# Patient Record
Sex: Male | Born: 1965 | Race: Black or African American | Hispanic: No | Marital: Married | Smoking: Light tobacco smoker
Health system: Southern US, Community
[De-identification: ages and names within clinical notes are randomized; demographics above are authoritative.]

## PROBLEM LIST (undated history)

## (undated) ENCOUNTER — Emergency Department (HOSPITAL_COMMUNITY): Admission: EM | Payer: BC Managed Care – PPO | Source: Home / Self Care

## (undated) DIAGNOSIS — Z789 Other specified health status: Secondary | ICD-10-CM

## (undated) DIAGNOSIS — F1910 Other psychoactive substance abuse, uncomplicated: Secondary | ICD-10-CM

## (undated) DIAGNOSIS — Z7289 Other problems related to lifestyle: Secondary | ICD-10-CM

## (undated) DIAGNOSIS — F1090 Alcohol use, unspecified, uncomplicated: Secondary | ICD-10-CM

## (undated) DIAGNOSIS — E785 Hyperlipidemia, unspecified: Secondary | ICD-10-CM

## (undated) DIAGNOSIS — Z91018 Allergy to other foods: Secondary | ICD-10-CM

## (undated) DIAGNOSIS — T782XXA Anaphylactic shock, unspecified, initial encounter: Secondary | ICD-10-CM

## (undated) HISTORY — DX: Hyperlipidemia, unspecified: E78.5

## (undated) HISTORY — PX: COLONOSCOPY: SHX174

## (undated) HISTORY — DX: Alcohol use, unspecified, uncomplicated: F10.90

## (undated) HISTORY — DX: Anaphylactic shock, unspecified, initial encounter: T78.2XXA

## (undated) HISTORY — PX: UPPER GASTROINTESTINAL ENDOSCOPY: SHX188

## (undated) HISTORY — DX: Other problems related to lifestyle: Z72.89

## (undated) HISTORY — PX: WISDOM TOOTH EXTRACTION: SHX21

## (undated) HISTORY — DX: Allergy to other foods: Z91.018

## (undated) HISTORY — DX: Other specified health status: Z78.9

## (undated) HISTORY — PX: SIGMOIDOSCOPY: SUR1295

## (undated) HISTORY — DX: Other psychoactive substance abuse, uncomplicated: F19.10

## (undated) SURGERY — ESOPHAGOGASTRODUODENOSCOPY (EGD) WITH PROPOFOL
Anesthesia: Monitor Anesthesia Care

---

## 1987-04-24 HISTORY — PX: OTHER SURGICAL HISTORY: SHX169

## 1998-01-12 ENCOUNTER — Emergency Department (HOSPITAL_COMMUNITY): Admission: EM | Admit: 1998-01-12 | Discharge: 1998-01-12 | Payer: Self-pay | Admitting: Emergency Medicine

## 1998-12-24 ENCOUNTER — Emergency Department (HOSPITAL_COMMUNITY): Admission: EM | Admit: 1998-12-24 | Discharge: 1998-12-24 | Payer: Self-pay | Admitting: Emergency Medicine

## 1998-12-31 ENCOUNTER — Emergency Department (HOSPITAL_COMMUNITY): Admission: EM | Admit: 1998-12-31 | Discharge: 1998-12-31 | Payer: Self-pay | Admitting: Emergency Medicine

## 1999-02-15 ENCOUNTER — Emergency Department (HOSPITAL_COMMUNITY): Admission: EM | Admit: 1999-02-15 | Discharge: 1999-02-15 | Payer: Self-pay | Admitting: Emergency Medicine

## 2000-04-01 ENCOUNTER — Emergency Department (HOSPITAL_COMMUNITY): Admission: EM | Admit: 2000-04-01 | Discharge: 2000-04-01 | Payer: Self-pay | Admitting: Emergency Medicine

## 2001-05-24 ENCOUNTER — Emergency Department (HOSPITAL_COMMUNITY): Admission: EM | Admit: 2001-05-24 | Discharge: 2001-05-24 | Payer: Self-pay | Admitting: Emergency Medicine

## 2003-01-02 ENCOUNTER — Emergency Department (HOSPITAL_COMMUNITY): Admission: EM | Admit: 2003-01-02 | Discharge: 2003-01-02 | Payer: Self-pay | Admitting: Emergency Medicine

## 2003-01-02 ENCOUNTER — Encounter: Payer: Self-pay | Admitting: Emergency Medicine

## 2003-02-25 ENCOUNTER — Emergency Department (HOSPITAL_COMMUNITY): Admission: EM | Admit: 2003-02-25 | Discharge: 2003-02-25 | Payer: Self-pay | Admitting: Emergency Medicine

## 2003-02-26 ENCOUNTER — Emergency Department (HOSPITAL_COMMUNITY): Admission: EM | Admit: 2003-02-26 | Discharge: 2003-02-26 | Payer: Self-pay | Admitting: Emergency Medicine

## 2005-11-30 ENCOUNTER — Ambulatory Visit (HOSPITAL_BASED_OUTPATIENT_CLINIC_OR_DEPARTMENT_OTHER): Admission: RE | Admit: 2005-11-30 | Discharge: 2005-11-30 | Payer: Self-pay | Admitting: Urology

## 2005-11-30 ENCOUNTER — Encounter (INDEPENDENT_AMBULATORY_CARE_PROVIDER_SITE_OTHER): Payer: Self-pay | Admitting: Specialist

## 2008-09-17 ENCOUNTER — Emergency Department (HOSPITAL_COMMUNITY): Admission: EM | Admit: 2008-09-17 | Discharge: 2008-09-17 | Payer: Self-pay | Admitting: Emergency Medicine

## 2010-08-01 LAB — POCT I-STAT, CHEM 8
BUN: 7 mg/dL (ref 6–23)
Chloride: 102 mEq/L (ref 96–112)
Hemoglobin: 16 g/dL (ref 13.0–17.0)

## 2010-08-01 LAB — POCT CARDIAC MARKERS
Myoglobin, poc: 121 ng/mL (ref 12–200)
Troponin i, poc: <0.05 ng/mL (ref 0.00–0.09)

## 2010-09-08 NOTE — Op Note (Signed)
NAME:  Parker Nguyen, Parker Nguyen            ACCOUNT NO.:  [id number]   MEDICAL RECORD NO.:  [id number]          PATIENT TYPE:  AMB   LOCATION:  [facility]                         FACILITY:  [facility]   PHYSICIAN:  Boston Service, M.D.DATE OF BIRTH:  01-13-1966   DATE OF PROCEDURE:  11/30/2005  DATE OF DISCHARGE:                                 OPERATIVE REPORT   PREOPERATIVE DIAGNOSIS:  Undesired fertility.  The patient has had a last  minute no show on outpatient office vasectomy twice, was offered vasectomy  under anesthesia which he strongly preferred.   PROCEDURE:  Vasectomy under anesthesia.   SURGEON:  Dr. Wanda Plump.   ASSISTANT:  None.   ANESTHESIA:  General.   SPECIMENS:  Right and left vas.   ESTIMATED BLOOD LOSS:  Minimal.   COMPLICATIONS:  None obvious.   DESCRIPTION:  The patient was prepped and draped, supine position.  After  institution of an adequate level of general anesthesia, small area of skin  and submucosa anesthetized with quarter percent lidocaine, anterior aspect  of the right and left hemi scrotum, vas brought to the skin.  Small  transverse incision made with the cautery.  Needle tip forceps dissected the  vas free from its surrounding subcutaneous attachments.  Ring forceps  brought the vas through the small incision.  It was clean distally and  proximally, divided.  The needle tip cautery was then inserted down the  lumen of the distal end of the vas which was then cauterized.  A small  segment of the vas was removed and sent to pathology.  Proximal end of the  vas was then oversewn with 4-0 chromic sewn back on itself in order to coapt  the vas lumen.  Dartos was closed with a figure-of-eight suture of 4-0  chromic.  Skin was closed with figure-of-eight suture of 4-0 chromic.  Identical technique was used on both the right and left sides.  The patient  tolerated the procedure well and was returned to recovery in satisfactory  condition.     ______________________________  Boston Service, M.D.     RH/MEDQ  D:  11/30/2005  T:  11/30/2005  Job:  [id number]   cc:   Oley Balm. Georgina Pillion, M.D.  Fax: [phone]

## 2010-09-08 NOTE — Consult Note (Signed)
NAME:  Parker Nguyen, Parker Nguyen                      ACCOUNT NO.:  [id number]   MEDICAL RECORD NO.:  [id number]                   PATIENT TYPE:  EMS   LOCATION:  ED                                   FACILITY:  [facility]   PHYSICIAN:  Artist Pais. Mina Marble, M.D.           DATE OF BIRTH:  1965/06/11   DATE OF CONSULTATION:  01/02/2003  DATE OF DISCHARGE:                                   CONSULTATION   REFERRED BY:  Bethann Berkshire, M.D.   REASON FOR CONSULTATION:  [name] is a 45 year old, right hand  dominant male who was working on his own car in his garage and had a large  object fall on his dominant right thumb and presents today with an obvious  open fracture to the distal phalanx, nail bed laceration, nail plate  fracture, etc.  He is an otherwise healthy 45 year old male with no known  drug allergies. No current medications, no recent hospitalizations or  surgeries.   FAMILY HISTORY:  Noncontributory.   SOCIAL HISTORY:  Noncontributory.   PHYSICAL EXAMINATION:  GENERAL:  Reveals a well-developed, well-nourished  male, pleasant.  EXTREMITIES:  Right thumb, he has an obvious open injury to the distal  phalanx with exposed distal phalangeal bone, fractured nail plate and nail  bed laceration. He is neurovascularly intact grossly to sharp dull. X-rays  showed a comminuted distal phalangeal fracture, the rest of his upper  extremity exam is normal.   DESCRIPTION OF PROCEDURE:  He was given 2% plain lidocaine digital sheath  block. After adequate anesthesia was obtained, he was prepped and draped in  the usual sterile fashion. A freer elevator was used to carefully elevate  the remaining nail plate from the underlying nail bed. The open fracture was  debrided of clot and grease, irrigated with a liter of normal of saline  using a syringe and an 18 gauge Angiocath. This was followed by reduction of  the fracture and repair of the nail bed using 6-0 undyed Vicryl and the skin  using 5-0 nylon. The remnants of the nail plate were then placed on the  eponychial fold to help keep the normal contour. It was then dressed on  Xeroform, 4 x 4, a Coban wrap and a volar split. The patient tolerated the  procedure well and was discharged from the emergency department with Keflex  500 mg n.p.o. q.i.d. for a week for antibiotic prophylaxis, #28, no refills  and also Vicodin 20 for pain, no refills, 1-2 every 3-4 hours as needed for  pain. Followup in my office in five days.                                               Artist Pais Mina Marble, M.D.    MAW/MEDQ  D:  01/02/2003  T:  01/02/2003  Job:  [id number]

## 2010-11-01 ENCOUNTER — Emergency Department (HOSPITAL_COMMUNITY)
Admission: EM | Admit: 2010-11-01 | Discharge: 2010-11-01 | Disposition: A | Discharge status: Home / Self Care | Payer: BC Managed Care – PPO | Attending: Emergency Medicine | Admitting: Emergency Medicine

## 2010-11-01 DIAGNOSIS — R112 Nausea with vomiting, unspecified: Secondary | ICD-10-CM | POA: Insufficient documentation

## 2010-11-01 DIAGNOSIS — R000 Tachycardia, unspecified: Secondary | ICD-10-CM | POA: Insufficient documentation

## 2010-11-01 DIAGNOSIS — I428 Other cardiomyopathies: Secondary | ICD-10-CM | POA: Insufficient documentation

## 2010-11-01 DIAGNOSIS — I10 Essential (primary) hypertension: Secondary | ICD-10-CM | POA: Insufficient documentation

## 2010-11-01 DIAGNOSIS — L2989 Other pruritus: Secondary | ICD-10-CM | POA: Insufficient documentation

## 2010-11-01 DIAGNOSIS — T7840XA Allergy, unspecified, initial encounter: Secondary | ICD-10-CM | POA: Insufficient documentation

## 2010-11-01 DIAGNOSIS — L298 Other pruritus: Secondary | ICD-10-CM | POA: Insufficient documentation

## 2010-11-01 DIAGNOSIS — R51 Headache: Secondary | ICD-10-CM | POA: Insufficient documentation

## 2010-11-01 DIAGNOSIS — R21 Rash and other nonspecific skin eruption: Secondary | ICD-10-CM | POA: Insufficient documentation

## 2015-11-08 DIAGNOSIS — M25541 Pain in joints of right hand: Secondary | ICD-10-CM | POA: Diagnosis not present

## 2015-12-13 DIAGNOSIS — T781XXA Other adverse food reactions, not elsewhere classified, initial encounter: Secondary | ICD-10-CM | POA: Diagnosis not present

## 2015-12-13 DIAGNOSIS — H1045 Other chronic allergic conjunctivitis: Secondary | ICD-10-CM | POA: Diagnosis not present

## 2015-12-13 DIAGNOSIS — R21 Rash and other nonspecific skin eruption: Secondary | ICD-10-CM | POA: Diagnosis not present

## 2015-12-28 ENCOUNTER — Encounter: Payer: Self-pay | Admitting: Adult Health

## 2015-12-28 ENCOUNTER — Ambulatory Visit (INDEPENDENT_AMBULATORY_CARE_PROVIDER_SITE_OTHER): Payer: BLUE CROSS/BLUE SHIELD | Admitting: Adult Health

## 2015-12-28 VITALS — BP 136/78 | Temp 98.6°F | Ht 69.0 in | Wt 168.8 lb

## 2015-12-28 DIAGNOSIS — F17200 Nicotine dependence, unspecified, uncomplicated: Secondary | ICD-10-CM | POA: Diagnosis not present

## 2015-12-28 DIAGNOSIS — Z7689 Persons encountering health services in other specified circumstances: Secondary | ICD-10-CM

## 2015-12-28 DIAGNOSIS — Z23 Encounter for immunization: Secondary | ICD-10-CM | POA: Diagnosis not present

## 2015-12-28 DIAGNOSIS — Z0189 Encounter for other specified special examinations: Secondary | ICD-10-CM

## 2015-12-28 DIAGNOSIS — Z7189 Other specified counseling: Secondary | ICD-10-CM | POA: Diagnosis not present

## 2015-12-28 MED ORDER — VARENICLINE TARTRATE 0.5 MG X 11 & 1 MG X 42 PO MISC: ORAL | 0 refills | Status: DC

## 2015-12-28 MED ORDER — VARENICLINE TARTRATE 1 MG PO TABS: 1.0000 mg | ORAL_TABLET | Freq: Two times a day (BID) | ORAL | 0 refills | Status: DC

## 2015-12-28 NOTE — Patient Instructions (Addendum)
It was great meeting you today!  Please follow up yearly for your physical exam.   You are due for your colonoscopy when you turn 50   Please let me know if you need anything  Health Maintenance, Male A healthy lifestyle and preventative care can promote health and wellness.  Maintain regular health, dental, and eye exams.  Eat a healthy diet. Foods like vegetables, fruits, whole grains, low-fat dairy products, and lean protein foods contain the nutrients you need and are low in calories. Decrease your intake of foods high in solid fats, added sugars, and salt. Get information about a proper diet from your health care provider, if necessary.  Regular physical exercise is one of the most important things you can do for your health. Most adults should get at least 150 minutes of moderate-intensity exercise (any activity that increases your heart rate and causes you to sweat) each week. In addition, most adults need muscle-strengthening exercises on 2 or more days a week.   Maintain a healthy weight. The body mass index (BMI) is a screening tool to identify possible weight problems. It provides an estimate of body fat based on height and weight. Your health care provider can find your BMI and can help you achieve or maintain a healthy weight. For males 20 years and older:  A BMI below 18.5 is considered underweight.  A BMI of 18.5 to 24.9 is normal.  A BMI of 25 to 29.9 is considered overweight.  A BMI of 30 and above is considered obese.  Maintain normal blood lipids and cholesterol by exercising and minimizing your intake of saturated fat. Eat a balanced diet with plenty of fruits and vegetables. Blood tests for lipids and cholesterol should begin at age 74 and be repeated every 5 years. If your lipid or cholesterol levels are high, you are over age 37, or you are at high risk for heart disease, you may need your cholesterol levels checked more frequently.Ongoing high lipid and  cholesterol levels should be treated with medicines if diet and exercise are not working.  If you smoke, find out from your health care provider how to quit. If you do not use tobacco, do not start.  Lung cancer screening is recommended for adults aged 53-80 years who are at high risk for developing lung cancer because of a history of smoking. A yearly low-dose CT scan of the lungs is recommended for people who have at least a 30-pack-year history of smoking and are current smokers or have quit within the past 15 years. A pack year of smoking is smoking an average of 1 pack of cigarettes a day for 1 year (for example, a 30-pack-year history of smoking could mean smoking 1 pack a day for 30 years or 2 packs a day for 15 years). Yearly screening should continue until the smoker has stopped smoking for at least 15 years. Yearly screening should be stopped for people who develop a health problem that would prevent them from having lung cancer treatment.  If you choose to drink alcohol, do not have more than 2 drinks per day. One drink is considered to be 12 oz (360 mL) of beer, 5 oz (150 mL) of wine, or 1.5 oz (45 mL) of liquor.  Avoid the use of street drugs. Do not share needles with anyone. Ask for help if you need support or instructions about stopping the use of drugs.  High blood pressure causes heart disease and increases the risk of stroke. High  blood pressure is more likely to develop in:  People who have blood pressure in the end of the normal range (100-139/85-89 mm Hg).  People who are overweight or obese.  People who are African American.  If you are 12-39 years of age, have your blood pressure checked every 3-5 years. If you are 55 years of age or older, have your blood pressure checked every year. You should have your blood pressure measured twice--once when you are at a hospital or clinic, and once when you are not at a hospital or clinic. Record the average of the two measurements. To  check your blood pressure when you are not at a hospital or clinic, you can use:  An automated blood pressure machine at a pharmacy.  A home blood pressure monitor.  If you are 69-78 years old, ask your health care provider if you should take aspirin to prevent heart disease.  Diabetes screening involves taking a blood sample to check your fasting blood sugar level. This should be done once every 3 years after age 11 if you are at a normal weight and without risk factors for diabetes. Testing should be considered at a younger age or be carried out more frequently if you are overweight and have at least 1 risk factor for diabetes.  Colorectal cancer can be detected and often prevented. Most routine colorectal cancer screening begins at the age of 48 and continues through age 62. However, your health care provider may recommend screening at an earlier age if you have risk factors for colon cancer. On a yearly basis, your health care provider may provide home test kits to check for hidden blood in the stool. A small camera at the end of a tube may be used to directly examine the colon (sigmoidoscopy or colonoscopy) to detect the earliest forms of colorectal cancer. Talk to your health care provider about this at age 68 when routine screening begins. A direct exam of the colon should be repeated every 5-10 years through age 7, unless early forms of precancerous polyps or small growths are found.  People who are at an increased risk for hepatitis B should be screened for this virus. You are considered at high risk for hepatitis B if:  You were born in a country where hepatitis B occurs often. Talk with your health care provider about which countries are considered high risk.  Your parents were born in a high-risk country and you have not received a shot to protect against hepatitis B (hepatitis B vaccine).  You have HIV or AIDS.  You use needles to inject street drugs.  You live with, or have sex  with, someone who has hepatitis B.  You are a man who has sex with other men (MSM).  You get hemodialysis treatment.  You take certain medicines for conditions like cancer, organ transplantation, and autoimmune conditions.  Hepatitis C blood testing is recommended for all people born from [birth year] through 1964 and any individual with known risk factors for hepatitis C.  Healthy men should no longer receive prostate-specific antigen (PSA) blood tests as part of routine cancer screening. Talk to your health care provider about prostate cancer screening.  Testicular cancer screening is not recommended for adolescents or adult males who have no symptoms. Screening includes self-exam, a health care provider exam, and other screening tests. Consult with your health care provider about any symptoms you have or any concerns you have about testicular cancer.  Practice safe sex. Use condoms and  avoid high-risk sexual practices to reduce the spread of sexually transmitted infections (STIs).  You should be screened for STIs, including gonorrhea and chlamydia if:  You are sexually active and are younger than 24 years.  You are older than 24 years, and your health care provider tells you that you are at risk for this type of infection.  Your sexual activity has changed since you were last screened, and you are at an increased risk for chlamydia or gonorrhea. Ask your health care provider if you are at risk.  If you are at risk of being infected with HIV, it is recommended that you take a prescription medicine daily to prevent HIV infection. This is called pre-exposure prophylaxis (PrEP). You are considered at risk if:  You are a man who has sex with other men (MSM).  You are a heterosexual man who is sexually active with multiple partners.  You take drugs by injection.  You are sexually active with a partner who has HIV.  Talk with your health care provider about whether you are at high risk of being  infected with HIV. If you choose to begin PrEP, you should first be tested for HIV. You should then be tested every 3 months for as long as you are taking PrEP.  Use sunscreen. Apply sunscreen liberally and repeatedly throughout the day. You should seek shade when your shadow is shorter than you. Protect yourself by wearing long sleeves, pants, a wide-brimmed hat, and sunglasses year round whenever you are outdoors.  Tell your health care provider of new moles or changes in moles, especially if there is a change in shape or color. Also, tell your health care provider if a mole is larger than the size of a pencil eraser.  A one-time screening for abdominal aortic aneurysm (AAA) and surgical repair of large AAAs by ultrasound is recommended for men aged 96-75 years who are current or former smokers.  Stay current with your vaccines (immunizations).   This information is not intended to replace advice given to you by your health care provider. Make sure you discuss any questions you have with your health care provider.   Document Released: 10/06/2007 Document Revised: 04/30/2014 Document Reviewed: 09/04/2010 Elsevier Interactive Patient Education [employer].

## 2015-12-28 NOTE — Progress Notes (Signed)
Patient presents to clinic today to establish care. He is a pleasant 50 year old male who  has a past medical history of Alcohol use ([place]) and Hypotension.   Acute Concerns: Establish Care   Chronic Issues:  Tobacco Use - He is currently smoking 10 cigarettes per day. He has quit in the past with Chantix and would like to try this again.   Health Maintenance: Dental -- Routine Eye Care Vision -- Does not see routine  Immunizations -- UTD  Colonoscopy - Never had Diet: Eats healthy  Exercise: Teaches boxing.   Past Medical History:  Diagnosis Date  . Alcohol use ([place])   . Hypotension     Past Surgical History:  Procedure Laterality Date  . Left arm fracture  1989    No current outpatient prescriptions on file prior to visit.   No current facility-administered medications on file prior to visit.     Allergies  Allergen Reactions  . Red Dye     "N/V, chills"    Family History  Problem Relation Age of Onset  . Cancer Mother   . Hypertension Mother   . Diabetes Mother   . Heart disease Father   . Kidney disease Father   . Diabetes Father   . Colon polyps Father     Social History   Social History  . Marital status: Married    Spouse name: N/A  . Number of children: N/A  . Years of education: N/A   Occupational History  . Not on file.   Social History Main Topics  . Smoking status: Current Every Day Smoker    Packs/day: 0.25    Types: Cigarettes  . Smokeless tobacco: Never Used  . Alcohol use 1.8 - 3.0 oz/week    3 - 5 Cans of beer per week  . Drug use: No  . Sexual activity: Not on file   Other Topics Concern  . 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   Respiratory: Negative.   Cardiovascular: Negative.   Genitourinary: Negative.   Musculoskeletal: Negative.   Neurological: Negative.   All other systems reviewed and are negative.   BP 136/78    Temp 98.6 F (37 C) (Oral)   Ht 5\' 9"  (1.753 m)   Wt 168 lb 12.8 oz (76.6 kg)   BMI 24.93 kg/m   Physical Exam  Constitutional: He is oriented to person, place, and time and well-developed, well-nourished, and in no distress. No distress.  Cardiovascular: Normal rate, regular rhythm, normal heart sounds and intact distal pulses.  Exam reveals no gallop and no friction rub.   No murmur heard. Pulmonary/Chest: Effort normal and breath sounds normal. No respiratory distress. He has no wheezes. He has no rales. He exhibits no tenderness.  Musculoskeletal: Normal range of motion. He exhibits no edema, tenderness or deformity.  Neurological: He is alert and oriented to person, place, and time. Gait normal. GCS score is 15.  Skin: Skin is warm and dry. No rash noted. He is not diaphoretic. No erythema. No pallor.  Psychiatric: Mood, memory, affect and judgment normal.  Nursing note and vitals reviewed.   Assessment/Plan: 1. Encounter to establish care - Follow up for CPE - Follow up sooner if needed - Encouraged healthy eating and exercise - Blood work done at work was scanned into file    2. Tobacco use  disorder - varenicline (CHANTIX CONTINUING MONTH PAK) 1 MG tablet; Take 1 tablet (1 mg total) by mouth 2 (two) times daily.  Dispense: 60 tablet; Refill: 0 - varenicline (CHANTIX STARTING MONTH PAK) 0.5 MG X 11 & 1 MG X 42 tablet; Take one 0.5 mg tablet by mouth once daily for 3 days, then increase to one 0.5 mg tablet twice daily for 4 days, then increase to one 1 mg tablet twice daily.  Dispense: 53 tablet; Refill: 0 Trial of chantix. Common side effects including rare risk of suicide ideation was discussed with the patient today.  Patient is instructed to go directly to the ED if this occurs.  We discussed that patient can continue to smoke for 1 week after starting chantix, but then must discontinue cigarettes.  He is also instructed to contact us prior to completion of the starter month  pack for an rx for the continuation month pack.  5 minutes spent with patient today on tobacco cessation counseling.  - Follow up in one month   3. Need for prophylactic vaccination and inoculation against influenza  - Flu Vaccine QUAD 36+ mos PF IM (Fluarix & Fluzone Quad PF)  4. Need for Tdap vaccination  - Tdap vaccine greater than or equal to 7yo IM  [name], NP

## 2015-12-29 ENCOUNTER — Telehealth: Payer: Self-pay

## 2015-12-29 NOTE — Telephone Encounter (Signed)
Had to resubmit PA on new form. Key: [id number]

## 2015-12-29 NOTE — Telephone Encounter (Signed)
Received PA request for Chantix Continuing Month Pak 1 mg tabs. PA submitted & is pending. [id number]

## 2016-01-17 ENCOUNTER — Telehealth: Payer: Self-pay

## 2016-01-17 NOTE — Telephone Encounter (Signed)
Received fax that Chantix is approved. Form faxed to pharmacy.

## 2016-07-24 ENCOUNTER — Emergency Department (HOSPITAL_COMMUNITY): Payer: BLUE CROSS/BLUE SHIELD

## 2016-07-24 ENCOUNTER — Inpatient Hospital Stay (HOSPITAL_COMMUNITY)
Admission: EM | Admit: 2016-07-24 | Discharge: 2016-07-27 | DRG: 915 | Disposition: A | Discharge status: Home / Self Care | Payer: BLUE CROSS/BLUE SHIELD | Attending: Pulmonary Disease | Admitting: Pulmonary Disease

## 2016-07-24 DIAGNOSIS — E876 Hypokalemia: Secondary | ICD-10-CM | POA: Diagnosis not present

## 2016-07-24 DIAGNOSIS — K922 Gastrointestinal hemorrhage, unspecified: Secondary | ICD-10-CM

## 2016-07-24 DIAGNOSIS — K921 Melena: Secondary | ICD-10-CM | POA: Diagnosis not present

## 2016-07-24 DIAGNOSIS — K7460 Unspecified cirrhosis of liver: Secondary | ICD-10-CM

## 2016-07-24 DIAGNOSIS — K9181 Other intraoperative complications of digestive system: Secondary | ICD-10-CM | POA: Diagnosis not present

## 2016-07-24 DIAGNOSIS — T380X5A Adverse effect of glucocorticoids and synthetic analogues, initial encounter: Secondary | ICD-10-CM | POA: Diagnosis present

## 2016-07-24 DIAGNOSIS — R0600 Dyspnea, unspecified: Secondary | ICD-10-CM | POA: Diagnosis not present

## 2016-07-24 DIAGNOSIS — I248 Other forms of acute ischemic heart disease: Secondary | ICD-10-CM | POA: Diagnosis not present

## 2016-07-24 DIAGNOSIS — D696 Thrombocytopenia, unspecified: Secondary | ICD-10-CM | POA: Diagnosis present

## 2016-07-24 DIAGNOSIS — S81801A Unspecified open wound, right lower leg, initial encounter: Secondary | ICD-10-CM | POA: Diagnosis not present

## 2016-07-24 DIAGNOSIS — I129 Hypertensive chronic kidney disease with stage 1 through stage 4 chronic kidney disease, or unspecified chronic kidney disease: Secondary | ICD-10-CM | POA: Diagnosis present

## 2016-07-24 DIAGNOSIS — T782XXS Anaphylactic shock, unspecified, sequela: Secondary | ICD-10-CM | POA: Diagnosis not present

## 2016-07-24 DIAGNOSIS — R740 Nonspecific elevation of levels of transaminase and lactic acid dehydrogenase [LDH]: Secondary | ICD-10-CM | POA: Diagnosis present

## 2016-07-24 DIAGNOSIS — D376 Neoplasm of uncertain behavior of liver, gallbladder and bile ducts: Secondary | ICD-10-CM | POA: Diagnosis not present

## 2016-07-24 DIAGNOSIS — R58 Hemorrhage, not elsewhere classified: Secondary | ICD-10-CM | POA: Diagnosis not present

## 2016-07-24 DIAGNOSIS — K7200 Acute and subacute hepatic failure without coma: Secondary | ICD-10-CM | POA: Diagnosis not present

## 2016-07-24 DIAGNOSIS — K7011 Alcoholic hepatitis with ascites: Secondary | ICD-10-CM | POA: Diagnosis not present

## 2016-07-24 DIAGNOSIS — T782XXA Anaphylactic shock, unspecified, initial encounter: Principal | ICD-10-CM | POA: Diagnosis present

## 2016-07-24 DIAGNOSIS — R188 Other ascites: Secondary | ICD-10-CM | POA: Diagnosis not present

## 2016-07-24 DIAGNOSIS — R404 Transient alteration of awareness: Secondary | ICD-10-CM | POA: Diagnosis not present

## 2016-07-24 DIAGNOSIS — K254 Chronic or unspecified gastric ulcer with hemorrhage: Secondary | ICD-10-CM | POA: Diagnosis not present

## 2016-07-24 DIAGNOSIS — D689 Coagulation defect, unspecified: Secondary | ICD-10-CM

## 2016-07-24 DIAGNOSIS — R0602 Shortness of breath: Secondary | ICD-10-CM | POA: Diagnosis not present

## 2016-07-24 DIAGNOSIS — N183 Chronic kidney disease, stage 3 (moderate): Secondary | ICD-10-CM | POA: Diagnosis present

## 2016-07-24 DIAGNOSIS — N179 Acute kidney failure, unspecified: Secondary | ICD-10-CM | POA: Diagnosis not present

## 2016-07-24 DIAGNOSIS — D1809 Hemangioma of other sites: Secondary | ICD-10-CM | POA: Diagnosis present

## 2016-07-24 DIAGNOSIS — K559 Vascular disorder of intestine, unspecified: Secondary | ICD-10-CM | POA: Diagnosis not present

## 2016-07-24 DIAGNOSIS — N200 Calculus of kidney: Secondary | ICD-10-CM | POA: Diagnosis not present

## 2016-07-24 DIAGNOSIS — K5791 Diverticulosis of intestine, part unspecified, without perforation or abscess with bleeding: Secondary | ICD-10-CM | POA: Diagnosis present

## 2016-07-24 DIAGNOSIS — R680 Hypothermia, not associated with low environmental temperature: Secondary | ICD-10-CM | POA: Diagnosis present

## 2016-07-24 DIAGNOSIS — R739 Hyperglycemia, unspecified: Secondary | ICD-10-CM | POA: Diagnosis present

## 2016-07-24 DIAGNOSIS — K2901 Acute gastritis with bleeding: Secondary | ICD-10-CM | POA: Diagnosis not present

## 2016-07-24 DIAGNOSIS — F17210 Nicotine dependence, cigarettes, uncomplicated: Secondary | ICD-10-CM | POA: Diagnosis present

## 2016-07-24 DIAGNOSIS — I517 Cardiomegaly: Secondary | ICD-10-CM | POA: Diagnosis not present

## 2016-07-24 DIAGNOSIS — I959 Hypotension, unspecified: Secondary | ICD-10-CM | POA: Diagnosis not present

## 2016-07-24 DIAGNOSIS — R160 Hepatomegaly, not elsewhere classified: Secondary | ICD-10-CM | POA: Diagnosis not present

## 2016-07-24 DIAGNOSIS — K7030 Alcoholic cirrhosis of liver without ascites: Secondary | ICD-10-CM | POA: Diagnosis not present

## 2016-07-24 DIAGNOSIS — F1010 Alcohol abuse, uncomplicated: Secondary | ICD-10-CM

## 2016-07-24 DIAGNOSIS — E872 Acidosis: Secondary | ICD-10-CM | POA: Diagnosis not present

## 2016-07-24 DIAGNOSIS — R4182 Altered mental status, unspecified: Secondary | ICD-10-CM | POA: Diagnosis not present

## 2016-07-24 DIAGNOSIS — K5190 Ulcerative colitis, unspecified, without complications: Secondary | ICD-10-CM | POA: Diagnosis not present

## 2016-07-24 DIAGNOSIS — K633 Ulcer of intestine: Secondary | ICD-10-CM | POA: Diagnosis not present

## 2016-07-24 DIAGNOSIS — K3189 Other diseases of stomach and duodenum: Secondary | ICD-10-CM | POA: Diagnosis not present

## 2016-07-24 LAB — RAPID URINE DRUG SCREEN, HOSP PERFORMED
Amphetamines: NOT DETECTED
Barbiturates: NOT DETECTED
Benzodiazepines: NOT DETECTED
COCAINE: NOT DETECTED
OPIATES: NOT DETECTED
TETRAHYDROCANNABINOL: NOT DETECTED

## 2016-07-24 LAB — I-STAT CG4 LACTIC ACID, ED: Lactic Acid, Venous: 12.18 mmol/L (ref 0.5–1.9)

## 2016-07-24 LAB — COMPREHENSIVE METABOLIC PANEL
ALK PHOS: 83 U/L (ref 38–126)
ALT: 146 U/L — AB (ref 17–63)
ANION GAP: 17 — AB (ref 5–15)
AST: 179 U/L — ABNORMAL HIGH (ref 15–41)
Albumin: 3.2 g/dL — ABNORMAL LOW (ref 3.5–5.0)
BILIRUBIN TOTAL: 1.1 mg/dL (ref 0.3–1.2)
BUN: 10 mg/dL (ref 6–20)
CALCIUM: 7.8 mg/dL — AB (ref 8.9–10.3)
CO2: 13 mmol/L — AB (ref 22–32)
CREATININE: 1.73 mg/dL — AB (ref 0.61–1.24)
Chloride: 110 mmol/L (ref 101–111)
GFR calc non Af Amer: 45 mL/min — ABNORMAL LOW (ref >60)
GFR, EST AFRICAN AMERICAN: 52 mL/min — AB (ref >60)
Glucose, Bld: 197 mg/dL — ABNORMAL HIGH (ref 65–99)
Potassium: 3.2 mmol/L — ABNORMAL LOW (ref 3.5–5.1)
SODIUM: 140 mmol/L (ref 135–145)
TOTAL PROTEIN: 5.1 g/dL — AB (ref 6.5–8.1)

## 2016-07-24 LAB — LACTIC ACID, PLASMA
LACTIC ACID, VENOUS: 5.9 mmol/L — AB (ref 0.5–1.9)
Lactic Acid, Venous: 6.8 mmol/L (ref 0.5–1.9)

## 2016-07-24 LAB — URINALYSIS, ROUTINE W REFLEX MICROSCOPIC
Bilirubin Urine: NEGATIVE
GLUCOSE, UA: NEGATIVE mg/dL
HGB URINE DIPSTICK: NEGATIVE
KETONES UR: NEGATIVE mg/dL
LEUKOCYTES UA: NEGATIVE
Nitrite: NEGATIVE
PROTEIN: NEGATIVE mg/dL
Specific Gravity, Urine: 1.004 — ABNORMAL LOW (ref 1.005–1.030)
pH: 5 (ref 5.0–8.0)

## 2016-07-24 LAB — HEMOGLOBIN AND HEMATOCRIT, BLOOD
HCT: 46.7 % (ref 39.0–52.0)
HEMATOCRIT: 46.9 % (ref 39.0–52.0)
HEMATOCRIT: 43.7 % (ref 39.0–52.0)
Hemoglobin: 16.5 g/dL (ref 13.0–17.0)
Hemoglobin: 16.7 g/dL (ref 13.0–17.0)
Hemoglobin: 15.6 g/dL (ref 13.0–17.0)

## 2016-07-24 LAB — HIV ANTIBODY (ROUTINE TESTING W REFLEX): HIV Screen 4th Generation wRfx: NONREACTIVE

## 2016-07-24 LAB — TROPONIN I
TROPONIN I: 0.13 ng/mL — AB (ref <0.03)
TROPONIN I: 1.08 ng/mL — AB (ref <0.03)
TROPONIN I: 0.96 ng/mL — AB (ref <0.03)
Troponin I: 0.24 ng/mL (ref <0.03)

## 2016-07-24 LAB — CBC WITH DIFFERENTIAL/PLATELET
Basophils Absolute: 0 10*3/uL (ref 0.0–0.1)
Basophils Relative: 0 %
EOS ABS: 0 10*3/uL (ref 0.0–0.7)
Eosinophils Relative: 0 %
HEMATOCRIT: 33.4 % — AB (ref 39.0–52.0)
HEMOGLOBIN: 12.2 g/dL — AB (ref 13.0–17.0)
LYMPHS ABS: 3 10*3/uL (ref 0.7–4.0)
LYMPHS PCT: 41 %
MCH: 32.8 pg (ref 26.0–34.0)
MCHC: 36.5 g/dL — AB (ref 30.0–36.0)
MCV: 89.8 fL (ref 78.0–100.0)
MONOS PCT: 0 %
Monocytes Absolute: 0 10*3/uL — ABNORMAL LOW (ref 0.1–1.0)
NEUTROS PCT: 59 %
Neutro Abs: 4.4 10*3/uL (ref 1.7–7.7)
Platelets: 126 10*3/uL — ABNORMAL LOW (ref 150–400)
RBC: 3.72 MIL/uL — AB (ref 4.22–5.81)
RDW: 13.3 % (ref 11.5–15.5)
WBC: 7.4 10*3/uL (ref 4.0–10.5)

## 2016-07-24 LAB — ABO/RH: ABO/RH(D): A POS

## 2016-07-24 LAB — TYPE AND SCREEN
ABO/RH(D): A POS
Antibody Screen: NEGATIVE

## 2016-07-24 LAB — GLUCOSE, CAPILLARY
GLUCOSE-CAPILLARY: 82 mg/dL (ref 65–99)
GLUCOSE-CAPILLARY: 107 mg/dL — AB (ref 65–99)

## 2016-07-24 LAB — APTT

## 2016-07-24 LAB — ETHANOL: Alcohol, Ethyl (B): <5 mg/dL (ref <5)

## 2016-07-24 LAB — PROTIME-INR
INR: 3.33
INR: 3.19
Prothrombin Time: 34.5 seconds — ABNORMAL HIGH (ref 11.4–15.2)
Prothrombin Time: 33.4 seconds — ABNORMAL HIGH (ref 11.4–15.2)

## 2016-07-24 LAB — MAGNESIUM: MAGNESIUM: 1.9 mg/dL (ref 1.7–2.4)

## 2016-07-24 LAB — OCCULT BLOOD X 1 CARD TO LAB, STOOL: FECAL OCCULT BLD: POSITIVE — AB

## 2016-07-24 LAB — PHOSPHORUS: PHOSPHORUS: 2.3 mg/dL — AB (ref 2.5–4.6)

## 2016-07-24 LAB — PROCALCITONIN: Procalcitonin: 1.91 ng/mL

## 2016-07-24 LAB — MRSA PCR SCREENING: MRSA BY PCR: NEGATIVE

## 2016-07-24 MED ORDER — HEPARIN SODIUM (PORCINE) 5000 UNIT/ML IJ SOLN: 5000.0000 [IU] | Freq: Three times a day (TID) | INTRAMUSCULAR | Status: DC

## 2016-07-24 MED ORDER — SODIUM CHLORIDE 0.9 % IV BOLUS (SEPSIS)
1000.0000 mL | Freq: Once | INTRAVENOUS | Status: AC
  Administered 2016-07-24: 1000 mL via INTRAVENOUS

## 2016-07-24 MED ORDER — EPINEPHRINE 0.3 MG/0.3ML IJ SOAJ
0.3000 mg | Freq: Once | INTRAMUSCULAR | Status: AC
  Administered 2016-07-24: 0.3 mg via INTRAMUSCULAR
  Filled 2016-07-24: qty 0.3

## 2016-07-24 MED ORDER — POTASSIUM CHLORIDE CRYS ER 20 MEQ PO TBCR: 20.0000 meq | EXTENDED_RELEASE_TABLET | Freq: Once | ORAL | Status: DC

## 2016-07-24 MED ORDER — PANTOPRAZOLE SODIUM 40 MG IV SOLR: 40.0000 mg | Freq: Two times a day (BID) | INTRAVENOUS | Status: DC

## 2016-07-24 MED ORDER — EPINEPHRINE PF 1 MG/ML IJ SOLN
0.5000 ug/min | INTRAVENOUS | Status: DC
  Filled 2016-07-24: qty 4

## 2016-07-24 MED ORDER — ONDANSETRON HCL 4 MG/2ML IJ SOLN
4.0000 mg | Freq: Four times a day (QID) | INTRAMUSCULAR | Status: DC | PRN
  Administered 2016-07-24 – 2016-07-25 (×2): 4 mg via INTRAVENOUS
  Filled 2016-07-24 (×2): qty 2

## 2016-07-24 MED ORDER — HYDRALAZINE HCL 20 MG/ML IJ SOLN
10.0000 mg | INTRAMUSCULAR | Status: DC | PRN
  Administered 2016-07-25 – 2016-07-26 (×2): 10 mg via INTRAVENOUS
  Filled 2016-07-24 (×2): qty 1

## 2016-07-24 MED ORDER — FAMOTIDINE IN NACL 20-0.9 MG/50ML-% IV SOLN: 20.0000 mg | Freq: Two times a day (BID) | INTRAVENOUS | Status: DC

## 2016-07-24 MED ORDER — DIPHENHYDRAMINE HCL 50 MG/ML IJ SOLN
25.0000 mg | Freq: Four times a day (QID) | INTRAMUSCULAR | Status: DC
  Administered 2016-07-24 – 2016-07-26 (×10): 25 mg via INTRAVENOUS
  Filled 2016-07-24 (×10): qty 1

## 2016-07-24 MED ORDER — FAMOTIDINE IN NACL 20-0.9 MG/50ML-% IV SOLN
20.0000 mg | Freq: Two times a day (BID) | INTRAVENOUS | Status: DC
  Administered 2016-07-24: 20 mg via INTRAVENOUS

## 2016-07-24 MED ORDER — SODIUM CHLORIDE 0.9 % IV SOLN
8.0000 mg/h | INTRAVENOUS | Status: DC
  Administered 2016-07-24 – 2016-07-26 (×5): 8 mg/h via INTRAVENOUS
  Filled 2016-07-24 (×9): qty 80

## 2016-07-24 MED ORDER — FOLIC ACID 5 MG/ML IJ SOLN
1.0000 mg | Freq: Every day | INTRAMUSCULAR | Status: DC
  Administered 2016-07-24 – 2016-07-26 (×3): 1 mg via INTRAVENOUS
  Filled 2016-07-24 (×4): qty 0.2

## 2016-07-24 MED ORDER — THIAMINE HCL 100 MG/ML IJ SOLN
100.0000 mg | Freq: Every day | INTRAMUSCULAR | Status: DC
  Administered 2016-07-24 – 2016-07-26 (×3): 100 mg via INTRAVENOUS
  Filled 2016-07-24 (×3): qty 2

## 2016-07-24 MED ORDER — LORATADINE 10 MG PO TABS
10.0000 mg | ORAL_TABLET | Freq: Every day | ORAL | Status: DC
  Administered 2016-07-24 – 2016-07-27 (×3): 10 mg via ORAL
  Filled 2016-07-24 (×4): qty 1

## 2016-07-24 MED ORDER — SODIUM CHLORIDE 0.9 % IV SOLN
INTRAVENOUS | Status: DC
  Administered 2016-07-24 – 2016-07-25 (×2): via INTRAVENOUS
  Administered 2016-07-25: 125 mL via INTRAVENOUS
  Administered 2016-07-26: 13:00:00 via INTRAVENOUS

## 2016-07-24 MED ORDER — INSULIN ASPART 100 UNIT/ML ~~LOC~~ SOLN
0.0000 [IU] | SUBCUTANEOUS | Status: DC
  Administered 2016-07-25 – 2016-07-27 (×11): 2 [IU] via SUBCUTANEOUS

## 2016-07-24 MED ORDER — SODIUM CHLORIDE 0.9 % IV SOLN
50.0000 ug/h | INTRAVENOUS | Status: DC
  Administered 2016-07-24 – 2016-07-25 (×3): 50 ug/h via INTRAVENOUS
  Filled 2016-07-24 (×5): qty 1

## 2016-07-24 MED ORDER — SODIUM CHLORIDE 0.9 % IV SOLN: 250.0000 mL | INTRAVENOUS | Status: DC | PRN

## 2016-07-24 MED ORDER — SODIUM CHLORIDE 0.9 % IV SOLN
80.0000 mg | Freq: Once | INTRAVENOUS | Status: AC
  Administered 2016-07-24: 11:00:00 80 mg via INTRAVENOUS
  Filled 2016-07-24: qty 80

## 2016-07-24 MED ORDER — FAMOTIDINE IN NACL 20-0.9 MG/50ML-% IV SOLN
20.0000 mg | INTRAVENOUS | Status: DC
  Filled 2016-07-24: qty 50

## 2016-07-24 MED ORDER — DIPHENHYDRAMINE HCL 50 MG/ML IJ SOLN
50.0000 mg | Freq: Four times a day (QID) | INTRAMUSCULAR | Status: DC | PRN
Start: 2016-07-24 — End: 2016-07-25

## 2016-07-24 MED ORDER — METHYLPREDNISOLONE SODIUM SUCC 125 MG IJ SOLR
60.0000 mg | Freq: Two times a day (BID) | INTRAMUSCULAR | Status: DC
  Administered 2016-07-24 – 2016-07-26 (×5): 60 mg via INTRAVENOUS
  Filled 2016-07-24 (×5): qty 2

## 2016-07-24 NOTE — Progress Notes (Signed)
Notified Dr. Ashok Cordia pt having a large amount of bloody (pink-tinged) diarrhea. Per MD will start a Protonix gtt. Will continue to monitor closely.

## 2016-07-24 NOTE — Progress Notes (Signed)
CRITICAL VALUE ALERT  Critical value received:  Lactic 5.9  Date of notification:  07/24/16  Time of notification:  7939  Critical value read back: yes  Nurse who received alert:  Lenna Sciara, RN  Verbally notified Dr. Ashok Cordia. This is expected as the value is trending down. Will continue to monitor.

## 2016-07-24 NOTE — Progress Notes (Signed)
Pt complaining of numbness in his tongue, stating it is coming from the Protonix gtt. Spoke with Pharm MD and was directed to notify the MD, d/t unlikely reaction to the Protonix. Per Dr. Ashok Cordia will continue to watch closely for now.

## 2016-07-24 NOTE — Progress Notes (Signed)
PCCM Attending Note: Notified by patient's nurse that he is having bloody bowel movements since arrival in the intensive care unit. Patient does have 2 peripheral IVs already placed. Currently hypotensive. 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  1. Checking an trending hemoglobin/hematocrit every 6 hours 2. Stat coags & stat type and screen 3. Protonix bolus and drip 4. Continue nothing by mouth status  5. Consulting GI after I assess the patient's results  Sonia Baller. Ashok Cordia, M.D. [facility] Pager:  [phone] After 3pm or if no response, call [phone] 9:52 AM 07/24/16

## 2016-07-24 NOTE — Progress Notes (Signed)
eLink Physician-Brief Progress Note Patient Name: Parker Nguyen DOB: [birth year] MRN: [id number]   Date of Service  07/24/2016  HPI/Events of Note  Hypertension - BP = 161/108. Patient is NPO.   eICU Interventions  Will order: 1. Hydralazine 10 mg IV Q 4 hours PRN SBP > 160 or DBP > 100.     Intervention Category Major Interventions: Hypertension - evaluation and management  Kayshaun Polanco [place] 07/24/2016, 4:29 PM

## 2016-07-24 NOTE — ED Provider Notes (Signed)
[facility] DEPT Provider Note   CSN: [id number] Arrival date & time: 07/24/16  3329    By signing my name below, I, Macon Large, attest that this documentation has been prepared under the direction and in the presence of Orpah Greek, MD. Electronically Signed: Macon Large, ED Scribe. 07/24/16. 3:55 AM.  History   Chief Complaint Chief Complaint  Patient presents with  . Allergic Reaction  . Altered Mental Status   The history is provided by the patient, the EMS personnel and a relative. No language interpreter was used.    LEVEL 5 CAVEAT: HPI and ROS limited due to acuity of condition.   HPI Comments: Parker Nguyen is a 51 y.o. male brought in by ambulance who presents to the Emergency Department presenting with sudden onset, allergic reaction that occurred PTA. Per EMS, they state pt was found at home sitting on a toilet in an unresponsive state. Pt had swollen eyelids and swollen cheeks. EMS notes they administered 0.3 of EPI and 50mg  of Benadryl followed by 125 Solumedrol on route to ED with minimal relief. 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 Pt is moaning and uncooperative. Per family, pt's last meal today was chicken. Pt's family denies any recent illicit drug use.   Past Medical History:  Diagnosis Date  . Alcohol use   . Hypotension     There are no active problems to display for this patient.   Past Surgical History:  Procedure Laterality Date  . Left arm fracture  1989       Home Medications    Prior to Admission medications   Not on File    Family History Family History  Problem Relation Age of Onset  . Cancer Mother   . Hypertension Mother   . Diabetes Mother   . Heart disease Father   . Kidney disease Father   . Diabetes Father   . Colon polyps Father     Social History Social History  Substance Use Topics  . Smoking  status: Current Every Day Smoker    Packs/day: 0.25    Types: Cigarettes  . Smokeless tobacco: Never Used  . Alcohol use 1.8 - 3.0 oz/week    3 - 5 Cans of beer per week     Allergies   Red dye   Review of Systems Review of Systems  Unable to perform ROS: Acuity of condition  All other systems reviewed and are negative.    Physical Exam Updated Vital Signs BP 118/64   Pulse (!) 104   Temp (!) 95.2 F (35.1 C) (Rectal)   Resp 20   SpO2 99%   Physical Exam  Constitutional: He is oriented to person, place, and time. He appears well-developed and well-nourished. No distress.  HENT:  Head: Normocephalic and atraumatic.  Right Ear: Hearing normal.  Left Ear: Hearing normal.  Nose: Nose normal.  Mouth/Throat: Oropharynx is clear and moist and mucous membranes are normal.  Eyes: Conjunctivae and EOM are normal. Pupils are equal, round, and reactive to light.  Neck: Normal range of motion. Neck supple.  Cardiovascular: Regular rhythm, S1 normal and S2 normal.  Exam reveals no gallop and no friction rub.   No murmur heard. Pulmonary/Chest: Effort normal and breath sounds normal. No respiratory distress. He exhibits no tenderness.  Abdominal: Soft. Normal appearance and bowel sounds are normal. There is no hepatosplenomegaly. There is no tenderness. There  is no rebound, no guarding, no tenderness at McBurney's point and negative Murphy's sign. No hernia.  Musculoskeletal: Normal range of motion.  Neurological: He is alert and oriented to person, place, and time. He has normal strength. No cranial nerve deficit or sensory deficit. Coordination normal. GCS eye subscore is 4. GCS verbal subscore is 5. GCS motor subscore is 6.  Not oriented.   Skin: Skin is warm, dry and intact. No rash noted. There is erythema. No cyanosis.  Diffused erythema throughout body.   Psychiatric: He has a normal mood and affect. His speech is normal and behavior is normal. Thought content normal.    Nursing note and vitals reviewed.    ED Treatments / Results   DIAGNOSTIC STUDIES: Oxygen Saturation is 96% on RA, normal by my interpretation.    COORDINATION OF CARE: 3:41 AM Discussed treatment plan with pt's family at bedside which includes labs and EKG and pt's family agreed to plan.   Labs (all labs ordered are listed, but only abnormal results are displayed) Labs Reviewed  CBC WITH DIFFERENTIAL/PLATELET - Abnormal; Notable for the following:       Result Value   RBC 3.72 (*)    Hemoglobin 12.2 (*)    HCT 33.4 (*)    MCHC 36.5 (*)    Platelets 126 (*)    Monocytes Absolute 0.0 (*)    All other components within normal limits  I-STAT CG4 LACTIC ACID, ED - Abnormal; Notable for the following:    Lactic Acid, Venous 12.18 (*)    All other components within normal limits  RAPID URINE DRUG SCREEN, HOSP PERFORMED  COMPREHENSIVE METABOLIC PANEL  ETHANOL  TROPONIN I  URINALYSIS, ROUTINE W REFLEX MICROSCOPIC    EKG  EKG Interpretation  Date/Time:  Tuesday July 24 2016 03:38:48 EDT Ventricular Rate:  130 PR Interval:    QRS Duration: 105 QT Interval:  326 QTC Calculation: 480 R Axis:   98 Text Interpretation:  Sinus tachycardia Borderline right axis deviation Repol abnrm suggests ischemia, diffuse leads, new since 2010 Confirmed by [facility]  MD, CHRISTOPHER [phone]) on 07/24/2016 3:59:24 AM       Radiology Dg Chest Port 1 View  Result Date: 07/24/2016 CLINICAL DATA:  Altered mental status.  Dyspnea this morning. EXAM: PORTABLE CHEST 1 VIEW COMPARISON:  None. FINDINGS: A single AP portable view of the chest demonstrates no focal airspace consolidation or alveolar edema. The lungs are grossly clear. There is no large effusion or pneumothorax. Cardiac and mediastinal contours appear unremarkable. IMPRESSION: No active disease. Electronically Signed   By: Andreas Newport M.D.   On: 07/24/2016 05:46    Procedures Procedures (including critical care time)  Medications  Ordered in ED Medications  EPINEPHrine (ADRENALIN) 4 mg in dextrose 5 % 250 mL (0.016 mg/mL) infusion (not administered)  sodium chloride 0.9 % bolus 1,000 mL (0 mLs Intravenous Stopped 07/24/16 0505)  EPINEPHrine (EPI-PEN) injection 0.3 mg (0.3 mg Intramuscular Given 07/24/16 0416)  sodium chloride 0.9 % bolus 1,000 mL (0 mLs Intravenous Stopped 07/24/16 0552)    Followed by  sodium chloride 0.9 % bolus 1,000 mL (1,000 mLs Intravenous New Bag/Given 07/24/16 0452)     Initial Impression / Assessment and Plan / ED Course  I have reviewed the triage vital signs and the nursing notes.  Pertinent labs & imaging results that were available during my care of the patient were reviewed by me and considered in my medical decision making (see chart for details).  Patient brought to the ER from home. Patient apparently awakened from sleep, went to the bathroom and then passed out. He had been complaining of itching and discomfort, noted to have swelling, erythema of the skin consistent with acute allergic reaction. Patient reportedly has had similar but less severe reactions to red dye in the past. He was administered epinephrine, Benadryl, Solu-Medrol by EMS. On arrival to the ER he was still hypotensive. He was given additional appendectomy and IV fluids. Patient initially seemed refractory to treatment, but after additional IV fluid resuscitation was initiated, blood pressure has improved.  Patient appears altered. He is alert and does answer questions, but there is a delay in the seems somewhat confused. He was noted to be hypothermic at arrival. There is no known signs of infection. Symptoms felt to be secondary to hypoperfusion. Head CT performed to further evaluate.  Patient's lactic acid is significantly elevated. Again not felt to be secondary to sepsis, but rather secondary to hypoperfusion.  Critical care consulted, will evaluate the patient in the ER for admission.  The patient is noted to have  a MAP's <65/ SBP's <90. With the current information available to me, I don't think the patient is in septic shock. The MAP's <65/ SBP's <90, is related to OTHER SHOCKanaphylaxis .  The patient is noted to have a lactate>4. With the current information available to me, I don't think the patient is in septic shock. The lactate>4, is related to OTHER SHOCK anaphylaxis.   CRITICAL CARE Performed by: Orpah Greek   Total critical care time: 35 minutes  Critical care time was exclusive of separately billable procedures and treating other patients.  Critical care was necessary to treat or prevent imminent or life-threatening deterioration.  Critical care was time spent personally by me on the following activities: development of treatment plan with patient and/or surrogate as well as nursing, discussions with consultants, evaluation of patient's response to treatment, examination of patient, obtaining history from patient or surrogate, ordering and performing treatments and interventions, ordering and review of laboratory studies, ordering and review of radiographic studies, pulse oximetry and re-evaluation of patient's condition.   Final Clinical Impressions(s) / ED Diagnoses   Final diagnoses:  Anaphylaxis, initial encounter    New Prescriptions New Prescriptions   No medications on file    I personally performed the services described in this documentation, which was scribed in my presence. The recorded information has been reviewed and is accurate.     Orpah Greek, MD 07/24/16 [phone]

## 2016-07-24 NOTE — H&P (Signed)
PULMONARY / CRITICAL CARE MEDICINE   Name: Parker Nguyen MRN: [id number] DOB: 17-Aug-1965    ADMISSION DATE:  07/24/2016 CONSULTATION DATE:  07/24/16  REFERRING MD:  Betsey Holiday - EDP  CHIEF COMPLAINT:  Allergic reaction  HISTORY OF PRESENT ILLNESS:  Parker Nguyen is a 51 y.o. male with PMH as outlined below. He was brought to [facility] ED 07/24/16 with concern for alelrgic reaction.  Per family, he has had allergy to red dye in the past and has reacted in similar fashion.  He was apparently sitting on toilet unresponsive and when fire arrived, he had agonal respirations, swollen eyelids, swollen chicks, diffuse erythema.  He was given epi, benadryl, solumedrol. Per family, pt apparently ate chittelins the night prior which possibly had hot sauce mixed in (likely contained red dye).  In ED, he was given 1 additional round of epi.  He was somewhat confused and acting not 100% normal, therefore was taken for head CT which was negative.  UDS also negative.  His vitals improved after additional epi and fluids.  However, PCCM was asked to admit to ICU for further observation.  PAST MEDICAL HISTORY :  He  has a past medical history of Alcohol use and Hypotension.  PAST SURGICAL HISTORY: He  has a past surgical history that includes Left arm fracture (1989).  Allergies  Allergen Reactions  . Red Dye     "N/V, chills"    No current facility-administered medications on file prior to encounter.    No current outpatient prescriptions on file prior to encounter.    FAMILY HISTORY:  His indicated that the status of his mother is unknown. He indicated that his father is alive.    SOCIAL HISTORY: He  reports that he has been smoking Cigarettes.  He has been smoking about 0.25 packs per day. He has never used smokeless tobacco. He reports that he drinks about 1.8 - 3.0 oz of alcohol per week . He reports that he does not use drugs.  REVIEW OF SYSTEMS:   All negative; except for those  that are bolded, which indicate positives.  Constitutional: weight loss, weight gain, night sweats, fevers, chills, fatigue, weakness.  HEENT: headaches, sore throat, sneezing, nasal congestion, post nasal drip, difficulty swallowing, tooth/dental problems, visual complaints, visual changes, ear aches. Neuro: difficulty with speech, weakness, numbness, ataxia. CV:  chest pain, orthopnea, PND, swelling in lower extremities, dizziness, palpitations, syncope.  Resp: cough, hemoptysis, dyspnea, wheezing. GI: heartburn, indigestion, abdominal pain, nausea , vomiting, diarrhea, constipation, change in bowel habits, loss of appetite, hematemesis, melena, hematochezia.  GU: dysuria, change in color of urine, urgency or frequency, flank pain, hematuria. MSK: joint pain or swelling, decreased range of motion. Psych: change in mood or affect, depression, anxiety, suicidal ideations, homicidal ideations. Skin: rash, itching, bruising.   SUBJECTIVE:   No chest pain, SOB.  VITAL SIGNS: BP 118/64   Pulse (!) 104   Temp (!) 95.2 F (35.1 C) (Rectal)   Resp 20   SpO2 99%   HEMODYNAMICS:    VENTILATOR SETTINGS:    INTAKE / OUTPUT: No intake/output data recorded.   PHYSICAL EXAMINATION: General: Adult male, in NAD. Neuro: A&O x 3, non-focal.  HEENT: Swollen eyes / cheeks / facies.  PERRL, sclerae anicteric. Cardiovascular: Tachy, regular, no M/R/G.  Lungs: Respirations even and unlabored.  CTA bilaterally, No W/R/R. Abdomen: BS x 4, soft, NT/ND.  Musculoskeletal: No gross deformities, no edema.  Skin: Diffuse erythema. Skin warm.  LABS:  BMET No results  for input(s): NA, K, CL, CO2, BUN, CREATININE, GLUCOSE in the last 168 hours.  Electrolytes No results for input(s): CALCIUM, MG, PHOS in the last 168 hours.  CBC  Recent Labs Lab 07/24/16 0538  WBC 7.4  HGB 12.2*  HCT 33.4*  PLT 126*    Coag's No results for input(s): APTT, INR in the last 168 hours.  Sepsis  Markers  Recent Labs Lab 07/24/16 0547  LATICACIDVEN 12.18*    ABG No results for input(s): PHART, PCO2ART, PO2ART in the last 168 hours.  Liver Enzymes No results for input(s): AST, ALT, ALKPHOS, BILITOT, ALBUMIN in the last 168 hours.  Cardiac Enzymes No results for input(s): TROPONINI, PROBNP in the last 168 hours.  Glucose No results for input(s): GLUCAP in the last 168 hours.  Imaging Dg Chest Port 1 View  Result Date: 07/24/2016 CLINICAL DATA:  Altered mental status.  Dyspnea this morning. EXAM: PORTABLE CHEST 1 VIEW COMPARISON:  None. FINDINGS: A single AP portable view of the chest demonstrates no focal airspace consolidation or alveolar edema. The lungs are grossly clear. There is no large effusion or pneumothorax. Cardiac and mediastinal contours appear unremarkable. IMPRESSION: No active disease. Electronically Signed   By: Andreas Newport M.D.   On: 07/24/2016 05:46     STUDIES:  CXR 4/3 > no acute process. CT head 4/3 > no acute process.  CULTURES: Blood 4/3 >  ANTIBIOTICS: None.  SIGNIFICANT EVENTS: 4/3 > admit.  LINES/TUBES: None.  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  Reportedly had chittelins the night prior with hot sauce (which likely had red dye in it).  Improving after subcutaneous epi and IV fluids.  ASSESSMENT / PLAN:  Anaphylaxis - improving after 2 rounds IM epi and IVF's. Plan: Continue pepcid, benadryl, solumedrol (mainly for symptomatic relief as these agents have no proven benefit in anaphylaxis). 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  Hypokalemia. Lactic acidosis - due to anaphylaxis. AKI. Plan: 20 mEq K x 1. Continue IVF's. Trend lactate.  Hx EtOH use. Plan: Thiamine, folate. EtOH cessation counseling.  Hypothermia - not felt to be due to sepsis despite elevated lactate (do not feel that they are related at this point). Plan: Defer  empiric abx for now. Assess PCT, if high then start empiric abx. Follow cultures.  Hyperglycemia - likely exacerbated by steroids. Plan: SSI. Assess Hgb A1c.  Family updated: Wife and son updated at bedside.  Interdisciplinary Family Meeting v Palliative Care Meeting:  Due by: 07/31/16.  CC time: 30 min.   Montey Hora, [place] [facility] Pager: [phone]  or [phone] 07/24/2016, 6:11 AM

## 2016-07-24 NOTE — Consult Note (Signed)
[facility] Consult: 1:22 PM 07/24/2016  LOS: 0 days    Referring Provider: Dr Ashok Cordia  Primary Care Physician:  [name], NP Primary Gastroenterologist:  unassigned     Reason for Consultation:  Blood tinged stools   HPI: Parker Nguyen is a 51 y.o. male.  PMH ETOH abuse.   S/P vasectomy in 2007.  s/p right thumb fx repair 2012.  Left UE fx 1989.    Patient was transported to the hospital early this morning by EMS after he was found in an unresponsive state sitting on the toilet at home with swelling of the eyelids and face. He has red dye allergy and this is similar to previous red dye allergic reactions. EMS administered dose of epinephrine, Benadryl and Solu-Medrol in route.  On arrival to the ED he reported that shortness of breath and itching had improved. He was admitted with diagnosis of anaphylaxis.  Head CT and a single view portable chest film were both normal.  Blood pressures were as low as 75/39 and heart rate as high as 125.  Both hypotension and tachycardia have resolved. He never had fevers or hypoxia. Foods consumed in the evening prior to onset of symptoms included takeout chitlins, possibly these contain some red hot sauce. Yogurt covered pretzels. He consumed a couple of beers.  Once transferred to the ICU he developed blood-tinged stools.  He's had 3 or 4 episodes over the last many hours, each of these are smaller volume than the previous. Dr. Milinda Hirschfeld suspected gastritis due to alcohol. Patient was started on Protonix bolus and drip. Hgb 12.2, MCV 89. WBCs not elevated. Platelets are low at 126K.  Significant is PT of 34.5 and INR of 3.3. Patient isn't on any anticoagulation.  Ethanol level <5.   Transaminase pattern of 179/146 suggestive of alcoholic hepatitis. Total bilirubin and  alkaline phosphatase are normal.  Albumin is a bit low at 3.2. Troponin I 0.13, 0.24, lactic acid 12.8 >> 6.8>> 5.9.  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 He has no history of ulcer disease or bloody diarrhea.  During previous episodes of anaphylaxis, patient has had nausea, vomiting and diarrhea after the acute symptoms have resolved. He has been throwing up nonbloody, partially digested food and passing pink/blood-tinged stools starting early this morning. He does not have visceral abdominal pain but is abdominal muscles feel sore as he performed multiple crunches and sit ups yesterday as part of his training regimen. Patient has never had a colonoscopy or upper endoscopy. Although the suspicion was that his previous episodes of anaphylaxis were due to red dye, there've been times when he had reactions and could not attribute them to consumption of red dye. He did have allergy testing, however he tells me that allergists are apparently unable to test for red dye allergies.    Family history significant for new diagnosis of cirrhosis in his eighties something-year-old father who has a  long-ago history of  alcohol abuse. Patient himself consumes 64 ounces of beer daily. 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 Patient denies any new, recently added medications. He doesn't use NSAIDs or aspirin products.     Past Medical History:  Diagnosis Date  . Alcohol use   . Hypotension     Past Surgical History:  Procedure Laterality Date  . Left arm fracture  1989    Prior to Admission medications   Not on File    Scheduled Meds: . diphenhydrAMINE  25 mg Intravenous Q6H  . famotidine (PEPCID) IV  20 mg Intravenous Q12H  . folic acid  1 mg Intravenous Daily  . insulin aspart  0-15 Units Subcutaneous Q4H  .  loratadine  10 mg Oral Daily  . methylPREDNISolone (SOLU-MEDROL) injection  60 mg Intravenous Q12H  . [START ON 07/27/2016] pantoprazole  40 mg Intravenous Q12H  . potassium chloride  20 mEq Oral Once  . thiamine  100 mg Intravenous Daily   Infusions: . sodium chloride 125 mL/hr at 07/24/16 0847  . pantoprozole (PROTONIX) infusion 8 mg/hr (07/24/16 1042)   PRN Meds: sodium chloride, diphenhydrAMINE, ondansetron (ZOFRAN) IV   Allergies as of 07/24/2016 - Review Complete 07/24/2016  Allergen Reaction Noted  . Red dye  12/28/2015    Family History  Problem Relation Age of Onset  . Cancer Mother   . Hypertension Mother   . Diabetes Mother   . Heart disease Father   . Kidney disease Father   . Diabetes Father   . Colon polyps Father     Social History   Social History  . Marital status: Married    Spouse name: N/A  . Number of children: N/A  . Years of education: N/A   Occupational History  . Not on file.   Social History Main Topics  . Smoking status: Current Every Day Smoker    Packs/day: 0.25    Types: Cigarettes  . Smokeless tobacco: Never Used  . Alcohol use 1.8 - 3.0 oz/week    3 - 5 Cans of beer per week  . Drug use: No  . Sexual activity: Not on file   Other Topics Concern  . Not on file   Social History Narrative   Works in [employer]: Constitutional:  Patient feels malaise today which is normal after an anaphylactic event ENT:  No nose bleeds Pulm:  Dyspnea is now resolved. No coughing. CV:  No palpitations, no LE edema.  No chest pain  GU:  No hematuria, no frequency GI:  Patient generally has a good appetite. Does not suffer from reflux disease. Has normal brown bowel movements on a daily basis. Heme:  No unusual bleeding or bruising   Transfusions:  No history of blood transfusions. Neuro:  No headaches, no peripheral tingling or numbness Derm:  No itching, no rash or sores.  Endocrine:  No sweats or  chills.  No polyuria or dysuria Immunization:  Did not inquire Travel:  None beyond local counties in last few months.    PHYSICAL EXAM: Vital signs in last 24 hours: Vitals:   07/24/16 1100 07/24/16 1200  BP: (!) 148/101 (!) 144/108  Pulse: 81   Resp: (!) 21 19  Temp: 97.9 F (36.6 C)    Wt Readings from Last 3 Encounters:  12/28/15 76.6 kg (168 lb 12.8 oz)    General: Pleasant, fully  alert, comfortable AAM with residual facial edema. Head:  Right facial edema and swelling around the eyes.   Eyes:  No scleral icterus or conjunctival pallor. EOMI Ears:  Not hard of hearing  Nose:  No congestion or discharge Mouth:  Moist, clear oral mucosa. Tongue midline. Neck:  No JVD, masses or thyromegaly Lungs:  Clear bilaterally. No labored breathing. No cough. Heart:  RRR. No MRG. S1, S2 present Abdomen:  Soft. Not tender or distended. No organomegaly or masses. Bowel sounds active.   Rectal:  Deferred digital rectal exam.Describes stools within the last couple of hours as pinkish/blood-tinged.   Musc/Skeltl:  No joint swelling, contracture deformities or redness Extremities:  No CCE.  Neurologic:  Alert. Oriented times 3. No limb weakness. No tremors. Skin:  No rashes or sores. Nodes:  No cervical adenopathy   Psych:  Cooperative, calm,  pleasant.  Intake/Output from previous day: 04/02 0701 - 04/03 0700 In: 4000 [I.V.:1000; IV Piggyback:3000] Out: -  Intake/Output this shift: Total I/O In: 59.6 [I.V.:59.6] Out: 4 [Stool:4]  LAB RESULTS:  Recent Labs  07/24/16 0538  WBC 7.4  HGB 12.2*  HCT 33.4*  PLT 126*   BMET Lab Results  Component Value Date   NA 140 07/24/2016   NA 139 09/17/2008   K 3.2 (L) 07/24/2016   K 3.2 (L) 09/17/2008   CL 110 07/24/2016   CL 102 09/17/2008   CO2 13 (L) 07/24/2016   GLUCOSE 197 (H) 07/24/2016   GLUCOSE 108 (H) 09/17/2008   BUN 10 07/24/2016   BUN 7 09/17/2008   CREATININE 1.73 (H) 07/24/2016   CREATININE 1.1 09/17/2008    CALCIUM 7.8 (L) 07/24/2016   LFT  Recent Labs  07/24/16 0538  PROT 5.1*  ALBUMIN 3.2*  AST 179*  ALT 146*  ALKPHOS 83  BILITOT 1.1   PT/INR Lab Results  Component Value Date   INR 3.33 07/24/2016   Hepatitis Panel No results for input(s): HEPBSAG, HCVAB, HEPAIGM, HEPBIGM in the last 72 hours. C-Diff No components found for: CDIFF Lipase  No results found for: LIPASE  Drugs of Abuse     Component Value Date/Time   LABOPIA NONE DETECTED 07/24/2016 0401   COCAINSCRNUR NONE DETECTED 07/24/2016 0401   LABBENZ NONE DETECTED 07/24/2016 0401   AMPHETMU NONE DETECTED 07/24/2016 0401   THCU NONE DETECTED 07/24/2016 0401   LABBARB NONE DETECTED 07/24/2016 0401     RADIOLOGY STUDIES: Ct Head Wo Contrast  Result Date: 07/24/2016 CLINICAL DATA:  Altered mental status.  Hypotension. EXAM: CT HEAD WITHOUT CONTRAST TECHNIQUE: Contiguous axial images were obtained from the base of the skull through the vertex without intravenous contrast. COMPARISON:  None. FINDINGS: Brain: There is no intracranial hemorrhage, mass or evidence of acute infarction. There is no extra-axial fluid collection. Gray matter and white matter appear normal. Cerebral volume is normal for age. Brainstem and posterior fossa are unremarkable. The CSF spaces appear normal. Vascular: No hyperdense vessel or unexpected calcification. Skull: Normal. Negative for fracture or focal lesion. Sinuses/Orbits: No acute finding. Other: None. IMPRESSION: Normal brain Electronically Signed   By: Andreas Newport M.D.   On: 07/24/2016 06:17   Dg Chest Port 1 View  Result Date: 07/24/2016 CLINICAL DATA:  Altered mental status.  Dyspnea this morning. EXAM: PORTABLE CHEST 1 VIEW COMPARISON:  None. FINDINGS: A single AP portable view of the chest demonstrates no focal airspace consolidation or alveolar edema. The lungs are grossly clear. There is no large effusion or pneumothorax. Cardiac and  mediastinal contours appear unremarkable.  IMPRESSION: No active disease. Electronically Signed   By: Andreas Newport M.D.   On: 07/24/2016 05:46     IMPRESSION:   *  Blood tinged stool.  ? Ischemic, ? Vasculitis, ? diverticular bleed.   *  Coagulopathy.  Repeat coags are in process for verification of true coagulopathy.    *  Transaminitis with AST >> ALT.  ? ETOH hepatitis?.  Question shock liver.  *  Anaphylaxis.  Improved with Solumedrol, Benadryl and epinephrine.  Patient with history of red dye allergy, so ? inadvertent consumption of red dye?  By patient's report, he has had similar reactions when he was not able to confirm any recent consumption of red dye so question is there something else that's going on with him systemically causing these reactions?  *  Elevated troponins    PLAN:     *   CT scan abdomen? To assess for colitis and liver disease.  His creatinine is 1.7 and BUN 10.  *  Follow up on currently processing PT/INR.     Azucena Freed  07/24/2016, 1:22 PM Pager: [phone]   Attending physician's note   I have taken a history, examined the patient and reviewed the chart. I agree with the Advanced Practitioner's note, impression and recommendations. 51 yr M with history of EtOH abuse >30 years, admitted to the hospital after he was found unresponsive by EMS this morning. He is having multiple episodes of hematochezia, bright red blood small volume. Elevated lactic acid 12.8 on admission down trended to 5 after IV hydration. Elevated creatinine with normal BUN. INR elevated at 3.3 not on any anticoagulation, platelet count 126. Hgb 12.2. Albumin 3.2 On exam no asterixis, alert oriented 3 No abdominal distention or hepatosplenomegaly Labs are concerning for cirrhosis and portal hypertension  Hematochezia could be secondary to hemorrhoidal hemorrhage, ischemic colitis, diverticular and cannot exclude variceal hemorrhage though seems less likely  Vitamin K 10 mg X 1 to improve Coagulopathy Follow-up  fibrinogen level Obtain abdominal ultrasound with Dopplers We'll hold off CT abdomen and pelvis given elevated creatinine to avoid further kidney injury Continue PPI gtt Octreotide gtt Monitor hemoglobin every 8 hours and transfuse as needed to maintain Hgb~7 We will plan for EGD tomorrow AM to evaluate for possible varices and portal hypertensive changes. Based on EGD findings do colon prep tomorrow for colonoscopy if continues to have hematochezia   K Denzil Magnuson, MD [phone] Mon-Fri 8a-5p [phone] after 5p, weekends, holidays

## 2016-07-24 NOTE — ED Triage Notes (Signed)
Pt from home via [facility]. Per family pt was sitting on toilet unresponsive. Per fire pt was having agonal respirations. Family sts pt has hx of allergy to red dye and he gets like this when he's having an allergic reaction. EMS gave pt 0.3mg  of EPI, 50mg  Benadryl & 125 Solumedrol. Pt moaning, not following commands and combative, not answering questions. Family poor historian as well. Family sts pt last meal was chicken, deny any ETOH or drug use.

## 2016-07-24 NOTE — ED Notes (Signed)
NT drawing labs.

## 2016-07-24 NOTE — ED Notes (Signed)
Admitting MD at the bedside.  

## 2016-07-24 NOTE — ED Notes (Signed)
Phlebotomy at the bedside  

## 2016-07-24 NOTE — ED Notes (Signed)
Pt being transported to [facility] at this time

## 2016-07-24 NOTE — Progress Notes (Signed)
CRITICAL VALUE ALERT  Critical value received: APTT   Date of notification:  07/24/16  Time of notification:  8563  Critical value read back: yes  Nurse who received alert:  Henrietta Dine RN  Verbally notified Dr. Silverio Decamp. No new orders at this time. Will continue to monitor.

## 2016-07-24 NOTE — Progress Notes (Signed)
Mr. Montelongo has been repeatedly instructed to use call bell and to not get out of bed without staff assistance. Explained to him that he has a lot of wires and IVs attached to him and that he could trip and fall,and seriously injure himself. He states "when I have to go, I have to go." Reassured him that staff would be there as soon as possible to assist him with is toileting needs when he pushes his call bell. Despite repeated education, he continues to get out of bed without assistance. He pushes the call light, then climbs out of bed. Re-educated on fall/safety precautions with each event. If side rail is placed up- he will climb over it. Side rail left down at this time. Bed in lowest position, bed alarm on. Call bell beside him. Yellow socks on. Will continue to provide for patient safety as best as I can, and will continue to reeducate on safety precautions. Richarda Blade RN

## 2016-07-24 NOTE — Progress Notes (Signed)
PCCM Attending Note: Patient seen briefly at bedside. 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 Did have emesis that was nonbloody. He reports he has never had any history of ulcers or bloody diarrhea. Currently normotensive to hypertensive. Consulting GI for further evaluation and recommendations.  Sonia Baller Ashok Cordia, M.D. [facility] Pager:  [phone] After 3pm or if no response, call [phone] 12:33 PM 07/24/16

## 2016-07-25 ENCOUNTER — Inpatient Hospital Stay (HOSPITAL_COMMUNITY): Payer: BLUE CROSS/BLUE SHIELD

## 2016-07-25 ENCOUNTER — Encounter (HOSPITAL_COMMUNITY)
Admission: EM | Disposition: A | Discharge status: Home / Self Care | Payer: Self-pay | Source: Home / Self Care | Attending: Pulmonary Disease

## 2016-07-25 ENCOUNTER — Encounter (HOSPITAL_COMMUNITY): Payer: Self-pay | Admitting: Certified Registered Nurse Anesthetist

## 2016-07-25 ENCOUNTER — Inpatient Hospital Stay (HOSPITAL_COMMUNITY): Payer: BLUE CROSS/BLUE SHIELD | Admitting: Certified Registered Nurse Anesthetist

## 2016-07-25 DIAGNOSIS — K7200 Acute and subacute hepatic failure without coma: Secondary | ICD-10-CM

## 2016-07-25 DIAGNOSIS — K633 Ulcer of intestine: Secondary | ICD-10-CM

## 2016-07-25 DIAGNOSIS — K922 Gastrointestinal hemorrhage, unspecified: Secondary | ICD-10-CM

## 2016-07-25 DIAGNOSIS — T782XXS Anaphylactic shock, unspecified, sequela: Secondary | ICD-10-CM

## 2016-07-25 DIAGNOSIS — K3189 Other diseases of stomach and duodenum: Secondary | ICD-10-CM

## 2016-07-25 HISTORY — PX: ESOPHAGOGASTRODUODENOSCOPY (EGD) WITH PROPOFOL: SHX5813

## 2016-07-25 HISTORY — PX: FLEXIBLE SIGMOIDOSCOPY: SHX5431

## 2016-07-25 LAB — PHOSPHORUS: Phosphorus: 4.4 mg/dL (ref 2.5–4.6)

## 2016-07-25 LAB — HEMOGLOBIN A1C
Hgb A1c MFr Bld: 5.3 % (ref 4.8–5.6)
Mean Plasma Glucose: 105 mg/dL

## 2016-07-25 LAB — HEPATIC FUNCTION PANEL
ALT: 1906 U/L — AB (ref 17–63)
AST: 2330 U/L — AB (ref 15–41)
Albumin: 3.4 g/dL — ABNORMAL LOW (ref 3.5–5.0)
Alkaline Phosphatase: 60 U/L (ref 38–126)
BILIRUBIN INDIRECT: 2.3 mg/dL — AB (ref 0.3–0.9)
Bilirubin, Direct: 0.4 mg/dL (ref 0.1–0.5)
TOTAL PROTEIN: 5.6 g/dL — AB (ref 6.5–8.1)
Total Bilirubin: 2.7 mg/dL — ABNORMAL HIGH (ref 0.3–1.2)

## 2016-07-25 LAB — DIC (DISSEMINATED INTRAVASCULAR COAGULATION)PANEL
Fibrinogen: 284 mg/dL (ref 210–475)
Platelets: 49 10*3/uL — ABNORMAL LOW (ref 150–400)
Smear Review: NONE SEEN
aPTT: 35 seconds (ref 24–36)

## 2016-07-25 LAB — BASIC METABOLIC PANEL
ANION GAP: 9 (ref 5–15)
BUN: 22 mg/dL — ABNORMAL HIGH (ref 6–20)
CALCIUM: 7.2 mg/dL — AB (ref 8.9–10.3)
CHLORIDE: 108 mmol/L (ref 101–111)
CO2: 18 mmol/L — ABNORMAL LOW (ref 22–32)
Creatinine, Ser: 1.87 mg/dL — ABNORMAL HIGH (ref 0.61–1.24)
GFR calc non Af Amer: 41 mL/min — ABNORMAL LOW (ref >60)
GFR, EST AFRICAN AMERICAN: 47 mL/min — AB (ref >60)
Glucose, Bld: 144 mg/dL — ABNORMAL HIGH (ref 65–99)
Potassium: 5.1 mmol/L (ref 3.5–5.1)
Sodium: 135 mmol/L (ref 135–145)

## 2016-07-25 LAB — CBC
HCT: 41.1 % (ref 39.0–52.0)
HCT: 38.2 % — ABNORMAL LOW (ref 39.0–52.0)
HEMOGLOBIN: 14.9 g/dL (ref 13.0–17.0)
Hemoglobin: 13.8 g/dL (ref 13.0–17.0)
MCH: 32.2 pg (ref 26.0–34.0)
MCH: 32 pg (ref 26.0–34.0)
MCHC: 36.3 g/dL — ABNORMAL HIGH (ref 30.0–36.0)
MCHC: 36.1 g/dL — AB (ref 30.0–36.0)
MCV: 88.8 fL (ref 78.0–100.0)
MCV: 88.6 fL (ref 78.0–100.0)
PLATELETS: 50 10*3/uL — AB (ref 150–400)
Platelets: 61 10*3/uL — ABNORMAL LOW (ref 150–400)
RBC: 4.63 MIL/uL (ref 4.22–5.81)
RBC: 4.31 MIL/uL (ref 4.22–5.81)
RDW: 13.3 % (ref 11.5–15.5)
RDW: 13.2 % (ref 11.5–15.5)
WBC: 12.8 10*3/uL — AB (ref 4.0–10.5)
WBC: 7.4 10*3/uL (ref 4.0–10.5)

## 2016-07-25 LAB — TECHNOLOGIST SMEAR REVIEW: Tech Review: INCREASED

## 2016-07-25 LAB — DIC (DISSEMINATED INTRAVASCULAR COAGULATION) PANEL
D DIMER QUANT: 9.46 ug{FEU}/mL — AB (ref 0.00–0.50)
INR: 1.57
PROTHROMBIN TIME: 18.9 s — AB (ref 11.4–15.2)

## 2016-07-25 LAB — GLUCOSE, CAPILLARY
GLUCOSE-CAPILLARY: 120 mg/dL — AB (ref 65–99)
GLUCOSE-CAPILLARY: 144 mg/dL — AB (ref 65–99)
GLUCOSE-CAPILLARY: 124 mg/dL — AB (ref 65–99)
Glucose-Capillary: 115 mg/dL — ABNORMAL HIGH (ref 65–99)
Glucose-Capillary: 148 mg/dL — ABNORMAL HIGH (ref 65–99)
Glucose-Capillary: 137 mg/dL — ABNORMAL HIGH (ref 65–99)
Glucose-Capillary: 140 mg/dL — ABNORMAL HIGH (ref 65–99)

## 2016-07-25 LAB — MAGNESIUM: MAGNESIUM: 1.7 mg/dL (ref 1.7–2.4)

## 2016-07-25 LAB — AMMONIA: AMMONIA: 42 umol/L — AB (ref 9–35)

## 2016-07-25 LAB — LACTIC ACID, PLASMA: Lactic Acid, Venous: 1.8 mmol/L (ref 0.5–1.9)

## 2016-07-25 SURGERY — SIGMOIDOSCOPY, FLEXIBLE
Anesthesia: Monitor Anesthesia Care

## 2016-07-25 SURGERY — ESOPHAGOGASTRODUODENOSCOPY (EGD) WITH PROPOFOL
Anesthesia: Monitor Anesthesia Care

## 2016-07-25 MED ORDER — SODIUM CHLORIDE 0.9 % IV SOLN
INTRAVENOUS | Status: DC
  Administered 2016-07-25: 12:00:00 via INTRAVENOUS

## 2016-07-25 MED ORDER — SODIUM CHLORIDE 0.9 % IV SOLN: 250.0000 mL | INTRAVENOUS | Status: DC | PRN

## 2016-07-25 MED ORDER — FENTANYL CITRATE (PF) 100 MCG/2ML IJ SOLN
12.5000 ug | Freq: Once | INTRAMUSCULAR | Status: AC
  Administered 2016-07-25: 12.5 ug via INTRAVENOUS
  Filled 2016-07-25: qty 2

## 2016-07-25 MED ORDER — LIDOCAINE HCL (CARDIAC) 20 MG/ML IV SOLN
INTRAVENOUS | Status: DC | PRN
  Administered 2016-07-25: 70 mg via INTRAVENOUS

## 2016-07-25 MED ORDER — SODIUM CHLORIDE 0.9 % IV SOLN
INTRAVENOUS | Status: DC | PRN
  Administered 2016-07-25: 12:00:00 via INTRAVENOUS

## 2016-07-25 MED ORDER — PROPOFOL 500 MG/50ML IV EMUL
INTRAVENOUS | Status: DC | PRN
  Administered 2016-07-25: 100 ug/kg/min via INTRAVENOUS

## 2016-07-25 NOTE — Anesthesia Postprocedure Evaluation (Signed)
Anesthesia Post Note  Patient: Lenoard [facility]bert [facility]  Procedure(s) Performed: Procedure(s) (LRB): ESOPHAGOGASTRODUODENOSCOPY (EGD) WITH PROPOFOL (N/A) FLEXIBLE SIGMOIDOSCOPY (N/A)  Patient location during evaluation: PACU Anesthesia Type: MAC Level of consciousness: awake and alert Pain management: pain level controlled Vital Signs Assessment: post-procedure vital signs reviewed and stable Respiratory status: spontaneous breathing Cardiovascular status: stable Anesthetic complications: no       Last Vitals:  Vitals:   07/25/16 1704 07/25/16 1711  BP: (!) 163/104   Pulse: 94   Resp: 17   Temp:  36.5 C    Last Pain:  Vitals:   07/25/16 1711  TempSrc: Oral  PainSc:                  Nolon Nations

## 2016-07-25 NOTE — Progress Notes (Addendum)
PULMONARY  / CRITICAL CARE MEDICINE  Name: Parker Nguyen MRN: [id number] DOB: 1966/01/29    LOS: 85  REFERRING MD :  ED  CHIEF COMPLAINT:  Allergic reaction  BRIEF PATIENT DESCRIPTION: Parker Nguyen is a 51 year old man with chronic alcohol abuse, hypertension hospitalized for anaphylaxis.   LINES / TUBES: 4/3 PIV x 2  SIGNIFICANT EVENTS:  4/3 >> Admitted for anaphylaxis.  4/4 >> AST/ALT increased to 2330/1906.  DIET:  NPO  DVT Px:  SCDs  GI Px:  Protonix  HISTORY OF PRESENT ILLNESS:  Parker Nguyen is a 51 y.o. male with PMH as outlined below. Parker Nguyen was brought to [facility] ED 07/24/16 with concern for alelrgic reaction.  Per family, Parker Nguyen has had allergy to red dye in the past and has reacted in similar fashion.  Parker Nguyen was apparently sitting on toilet unresponsive and when fire arrived, Parker Nguyen had agonal respirations, swollen eyelids, swollen chicks, diffuse erythema.  Parker Nguyen was given epi, benadryl, solumedrol. Per family, pt apparently ate chittelins the night prior which possibly had hot sauce mixed in (likely contained red dye).  In ED, Parker Nguyen was given 1 additional round of epi.  Parker Nguyen was somewhat confused and acting not 100% normal, therefore was taken for head CT which was negative.  UDS also negative.  His vitals improved after additional epi and fluids.  However, PCCM was asked to admit to ICU for further observation.  PAST MEDICAL HISTORY :  Past Medical History:  Diagnosis Date  . Alcohol use   . Hypotension    Past Surgical History:  Procedure Laterality Date  . Left arm fracture  1989   Prior to Admission medications   Not on File   Allergies  Allergen Reactions  . Red Dye     "N/V, chills"    FAMILY HISTORY:  Family History  Problem Relation Age of Onset  . Cancer Mother   . Hypertension Mother   . Diabetes Mother   . Heart disease Father   . Kidney disease Father   . Diabetes Father   . Colon polyps Father    SOCIAL HISTORY:  reports that Parker Nguyen has  been smoking Cigarettes.  Parker Nguyen has been smoking about 0.25 packs per day. Parker Nguyen has never used smokeless tobacco. Parker Nguyen reports that Parker Nguyen drinks about 1.8 - 3.0 oz of alcohol per week . Parker Nguyen reports that Parker Nguyen does not use drugs.  INTERVAL HISTORY: This morning, Parker Nguyen had another bloody bowel movement. Parker Nguyen feels some abdominal pain. Parker Nguyen acknowledged to me occasional acetaminophen use but denied any herbal/natural medications.   VITAL SIGNS: Temp:  [97.8 F (36.6 C)-98.4 F (36.9 C)] 98.3 F (36.8 C) (04/04 0805) Pulse Rate:  [79-93] 79 (04/04 0800) Resp:  [5-29] 14 (04/04 0800) BP: (131-172)/(87-112) 163/105 (04/04 0800) SpO2:  [91 %-100 %] 98 % (04/04 0800) Weight:  [171 lb 11.8 oz (77.9 kg)] 171 lb 11.8 oz (77.9 kg) (04/03 0915)    INTAKE / OUTPUT: Intake/Output      04/03 0701 - 04/04 0700 04/04 0701 - 04/05 0700   I.V. (mL/kg) 3627.1 (46.6) 175 (2.2)   IV Piggyback     Total Intake(mL/kg) 3627.1 (46.6) 175 (2.2)   Net +3627.1 +175        Urine Occurrence 6 x    Stool Occurrence 29 x 1 x   Emesis Occurrence 1 x      PHYSICAL EXAMINATION: Physical Exam  Constitutional: Parker Nguyen is oriented to person, place, and time. No distress.  HENT:  Head: Normocephalic and atraumatic.  Lesion noted under the tongue.  Eyes: Conjunctivae are normal. No scleral icterus.  Cardiovascular: Normal rate and regular rhythm.   Pulmonary/Chest: Effort normal. No respiratory distress.  Abdominal: Soft. Bowel sounds are normal. There is tenderness (Deep palpation of RUQ).  Musculoskeletal: Parker Nguyen exhibits no edema.  Neurological: Parker Nguyen is alert and oriented to person, place, and time.  Skin: Parker Nguyen is not diaphoretic.      LABS: Cbc  Recent Labs Lab 07/24/16 0538  07/24/16 1522 07/24/16 2219 07/25/16 0339  WBC 7.4  --   --   --  12.8*  HGB 12.2*  < > 16.7 15.6 14.9  HCT 33.4*  < > 46.9 43.7 41.1  PLT 126*  --   --   --  61*  < > = values in this interval not displayed.  Chemistry   Recent Labs Lab  07/24/16 0538 07/24/16 0733 07/25/16 0339  NA 140  --  135  K 3.2*  --  5.1  CL 110  --  108  CO2 13*  --  18*  BUN 10  --  22*  CREATININE 1.73*  --  1.87*  CALCIUM 7.8*  --  7.2*  MG  --  1.9 1.7  PHOS  --  2.3* 4.4  GLUCOSE 197*  --  144*    Liver fxn  Recent Labs Lab 07/24/16 0538 07/25/16 0339  AST 179* 2,330*  ALT 146* 1,906*  ALKPHOS 83 60  BILITOT 1.1 2.7*  PROT 5.1* 5.6*  ALBUMIN 3.2* 3.4*   coags  Recent Labs Lab 07/24/16 1115 07/24/16 1313  APTT  --  >200*  INR 3.33 3.19   Sepsis markers  Recent Labs Lab 07/24/16 0733 07/24/16 1115 07/25/16 0530  LATICACIDVEN 6.8* 5.9* 1.8  PROCALCITON 1.91  --   --    Cardiac markers  Recent Labs Lab 07/24/16 0733 07/24/16 1313 07/24/16 2219  TROPONINI 0.24* 1.08* 0.96*   BNP No results for input(s): PROBNP in the last 168 hours. ABG No results for input(s): PHART, PCO2ART, PO2ART, HCO3, TCO2 in the last 168 hours.  CBG trend  Recent Labs Lab 07/24/16 1210 07/24/16 1619 07/25/16 0432 07/25/16 0803  GLUCAP 82 107* 144* 148*    IMAGING: 4/04 RUQ [place] pending  DIAGNOSES: Active Problems:   Anaphylaxis   ASSESSMENT / PLAN:  PULMONARY  ASSESSMENT: Anaphylaxis Tobacco abuse  PLAN:   -Continue diphenhydramine and SoluMedrol for another day -Encourage cessation. -Recommend outpatient follow-up for oral lesion   CARDIOVASCULAR  ASSESSMENT:  Hypertension: Overnight trending 130s-170s/90s-110s.   PLAN:  -Continue hydralazine as needed though need to be cautious with ongoing bleeding to aggressively treat as Parker Nguyen likely has longstanding hypertension   RENAL  ASSESSMENT:   Elevated creatinine vs CKD Stage 3: Unclear if this is new baseline as prior creatinine was 1.1 in May 2010 and is now 1.8-1.7.   PLAN:   -Monitor I&O, daily weights, urine output -Trend electrolytes daily   GASTROINTESTINAL  ASSESSMENT:   GI bleeding: Hb stable at 12-14. BP elevated overnight but not  hypotensive. Elevated BUN this morning is suggestive of upper GI source.  Transaminitis: Given the marked elevated, ischemic injury is suspect in the setting of hypoperfusion with some volume depletion from bleeding.  PLAN:   -Continue pantoprazole and octreotide pending EGD findings -Follow-up RUQ [place] -GI following, appreciate recommendations   HEMATOLOGIC  ASSESSMENT:   Coagulopathic: Suspect liver disease though will exclude DIC.  Thrombocytopenic: Platelets 61, down from 126  from admission, likely in the setting of liver disease.  PLAN:  -Send DIC panel and smear review -Trend LFTs daily   ENDOCRINE  ASSESSMENT:   Hyperglycemia: A1c 5.3 which is reassuring.    PLAN:   -Counsel on lifestyle modification   CLINICAL SUMMARY: Parker Nguyen is a 51 year old man with chronic alcohol abuse, hypertension hospitalized for anaphylaxis complicated by ischemic hepatitis and GI bleeding.  Charlott Rakes, PGY3 Internal Medicine Pager: [phone]  07/25/2016, 8:45 AM    Attending Note:  I have examined patient, reviewed labs, studies and notes. I have discussed the case with Dr Posey Pronto, and I agree with the data and plans as amended above. 51 yo man, hx EtOH use, HTN, documented sensitivity to Red dye. Parker Nguyen experienced an apparent anaphylactic reaction pm 4/3 with associated facial edema, hypotension. Parker Nguyen was treated with epi, IVF, steroids, benadryl. Subsequent course c/b GI bleeding, abd pain. Parker Nguyen was started n protonix gtt, octreotide. EGD and flex sig were performed today > show possible sequelae of ischemic injury with gastric, duodenal, sigmoid friability and evidence recent hemorrhage. Labs revealed progressive severe thrombocytopenia, etiology unclear. On my eval Parker Nguyen is comfortable, still with some facial edema but improved per his report. Parker Nguyen has a dark raised area of oral mucosa beneath his tongue that looks ecchymotic, possibly a hematoma? This is not painful. Lungs are clear, heart  regular. Abdomen non-tender. We will stop the octreotide, continue protonix for another day. Etiology of GIB felt to be transient ischemia possibly due to anaphylactic shock. Lowest SBP recorded was in the 90"s.  Check DIC and HITT panels, follow CBC. Follow the oral mucosal lesion - possibly will decrease in size as swelling resolves. If it remains enlarged and discolored then we will have ENT evaluate it. Abdominal US pending to evaluate for cirrhosis, liver injury, in setting transaminitis.  Independent critical care time is 35 minutes.   Baltazar Apo, MD, PhD 07/25/2016, 5:11 PM [facility] [phone] or if no answer [phone]

## 2016-07-25 NOTE — Op Note (Signed)
[facility] Patient Name: Parker Nguyen Procedure Date : 07/25/2016 MRN: [id number] Attending MD: Mauri Pole , MD Date of Birth: Apr 24, 1965 CSN: [id number] Age: 51 Admit Type: Inpatient Procedure:                Flexible Sigmoidoscopy Indications:              Hematochezia Providers:                Mauri Pole, MD, Kingsley Plan, RN,                            William Dalton, Technician Referring MD:              Medicines:                Monitored Anesthesia Care Complications:            No immediate complications. Estimated Blood Loss:     Estimated blood loss: none. Procedure:                Pre-Anesthesia Assessment:                           - Prior to the procedure, a History and Physical                            was performed, and patient medications and                            allergies were reviewed. The patient's tolerance of                            previous anesthesia was also reviewed. The risks                            and benefits of the procedure and the sedation                            options and risks were discussed with the patient.                            All questions were answered, and informed consent                            was obtained. Prior Anticoagulants: The patient has                            taken no previous anticoagulant or antiplatelet                            agents. ASA Grade Assessment: III - A patient with                            severe systemic disease. After reviewing the risks  and benefits, the patient was deemed in                            satisfactory condition to undergo the procedure.                           - Prior to the procedure, a History and Physical                            was performed, and patient medications and                            allergies were reviewed. The patient's tolerance of                            previous  anesthesia was also reviewed. The risks                            and benefits of the procedure and the sedation                            options and risks were discussed with the patient.                            All questions were answered, and informed consent                            was obtained. Prior Anticoagulants: The patient has                            taken no previous anticoagulant or antiplatelet                            agents. ASA Grade Assessment: III - A patient with                            severe systemic disease. After reviewing the risks                            and benefits, the patient was deemed in                            satisfactory condition to undergo the procedure.                           After obtaining informed consent, the scope was                            passed under direct vision. The EC-[id number] ([id number])                            scope was introduced through the anus and advanced  to the the splenic flexure. The flexible                            sigmoidoscopy was accomplished without difficulty.                            The patient tolerated the procedure well. The                            quality of the bowel preparation was good. Scope In: 12:30:35 PM Scope Out: 12:35:22 PM Total Procedure Duration: 0 hours 4 minutes 47 seconds  Findings:      A continuous area of bleeding ulcerated mucosa with stigmata of recent       bleeding was present in the recto-sigmoid colon, in the sigmoid colon       and in the descending colon.      No rectal varices Impression:               - Mucosal ulceration likely secondary to ischemic                            injury                           - No specimens collected. Recommendation:           - Clear liquid diet.                           - Continue present medications.                           - Colonoscopy at age 70 Procedure Code(s):        ---  Professional ---                           [phone], Sigmoidoscopy, flexible; diagnostic,                            including collection of specimen(s) by brushing or                            washing, when performed (separate procedure) Diagnosis Code(s):        --- Professional ---                           K63.3, Ulcer of intestine                           K92.1, Melena (includes Hematochezia) CPT copyright 2016 American [employer]. All rights reserved. The codes documented in this report are preliminary and upon coder review may  be revised to meet current compliance requirements. Mauri Pole, MD 07/25/2016 12:54:54 PM This report has been signed electronically. Number of Addenda: 0

## 2016-07-25 NOTE — Op Note (Addendum)
[facility] Patient Name: Steffan Caniglia Procedure Date : 07/25/2016 MRN: [id number] Attending MD: Mauri Pole , MD Date of Birth: [birth year] CSN: [id number] Age: 51 Admit Type: Inpatient Procedure:                Upper GI endoscopy Indications:              Active gastrointestinal bleeding, Gastrointestinal                            bleeding of unknown origin Providers:                Mauri Pole, MD, Kingsley Plan, RN,                            William Dalton, Technician Referring MD:              Medicines:                Monitored Anesthesia Care Complications:            No immediate complications. Estimated Blood Loss:     Estimated blood loss was minimal. Procedure:                Pre-Anesthesia Assessment:                           - Prior to the procedure, a History and Physical                            was performed, and patient medications and                            allergies were reviewed. The patient's tolerance of                            previous anesthesia was also reviewed. The risks                            and benefits of the procedure and the sedation                            options and risks were discussed with the patient.                            All questions were answered, and informed consent                            was obtained. Prior Anticoagulants: The patient has                            taken no previous anticoagulant or antiplatelet                            agents. ASA Grade Assessment: III - A patient with  severe systemic disease. After reviewing the risks                            and benefits, the patient was deemed in                            satisfactory condition to undergo the procedure.                           After obtaining informed consent, the endoscope was                            passed under direct vision. Throughout the   procedure, the patient's blood pressure, pulse, and                            oxygen saturations were monitored continuously. The                            [id number] ([id number]) scope was introduced through the                            mouth, and advanced to the third part of duodenum.                            The upper GI endoscopy was accomplished without                            difficulty. The patient tolerated the procedure                            well. Scope In: Scope Out: Findings:      The esophagus was normal. Regular Z-line at 40cm. No esophageal varices.      Patchy severely friable mucosa with patchy linear ulcers in the gastric       antrum and body with spontaneous bleeding was found in the stomach. No       gastric varices.      Diffuse severely friable mucosa with active bleeding was found in the       duodenal bulb and in the second portion of the duodenum. Impression:               - Normal esophagus.                           - Friable gastric mucosa.                           - Bleeding friable duodenal mucosa likely secondary                            to ischemic injury                           - No specimens collected. Moderate Sedation:      N/A Recommendation:           -  Clear liquid diet.                           - Continue present medications.                           - No aspirin, ibuprofen, naproxen, or other                            non-steroidal anti-inflammatory drugs for 8 weeks.                           - Repeat upper endoscopy in 3 months to check                            healing. Procedure Code(s):        --- Professional ---                           [phone], Esophagogastroduodenoscopy, flexible,                            transoral; diagnostic, including collection of                            specimen(s) by brushing or washing, when performed                            (separate procedure) Diagnosis Code(s):        ---  Professional ---                           K31.89, Other diseases of stomach and duodenum                           K92.2, Gastrointestinal hemorrhage, unspecified CPT copyright 2016 American Medical Association. All rights reserved. The codes documented in this report are preliminary and upon coder review may  be revised to meet current compliance requirements. Mauri Pole, MD 07/25/2016 12:44:23 PM This report has been signed electronically. Number of Addenda: 0

## 2016-07-25 NOTE — Transfer of Care (Signed)
Immediate Anesthesia Transfer of Care Note  Patient: Parker Nguyen  Procedure(s) Performed: Procedure(s): ESOPHAGOGASTRODUODENOSCOPY (EGD) WITH PROPOFOL (N/A) FLEXIBLE SIGMOIDOSCOPY (N/A)  Patient Location: Endoscopy Unit  Anesthesia Type:MAC  Level of Consciousness: awake, alert , patient cooperative and responds to stimulation  Airway & Oxygen Therapy: Patient Spontanous Breathing and Patient connected to nasal cannula oxygen  Post-op Assessment: Report given to RN and Post -op Vital signs reviewed and stable  Post vital signs: Reviewed and stable  Last Vitals:  Vitals:   07/25/16 0900 07/25/16 1140  BP: (!) 153/107 (!) 149/97  Pulse: 75 88  Resp: 19 (!) 24  Temp:  36.7 C    Last Pain:  Vitals:   07/25/16 1140  TempSrc: Oral  PainSc:          Complications: No apparent anesthesia complications

## 2016-07-25 NOTE — H&P (View-Only) (Signed)
Daily Rounding Note  07/25/2016, 8:17 AM  LOS: 1 day   SUBJECTIVE:   Chief complaint: pink tinged bloody stools, multiple overnight.  No abd pain.  No n/v.  Feels under the weather but no trouble breathing    OBJECTIVE:         Vital signs in last 24 hours:    Temp:  [97.8 F (36.6 C)-98.4 F (36.9 C)] 98.3 F (36.8 C) (04/04 0805) Pulse Rate:  [79-93] 79 (04/04 0800) Resp:  [5-29] 14 (04/04 0800) BP: (131-172)/(87-112) 163/105 (04/04 0800) SpO2:  [91 %-100 %] 98 % (04/04 0800) Weight:  [77.9 kg (171 lb 11.8 oz)] 77.9 kg (171 lb 11.8 oz) (04/03 0915) Last BM Date: 07/25/16 Filed Weights   07/24/16 0915  Weight: 77.9 kg (171 lb 11.8 oz)   General: pleasant, alert, comfortable.  Still with residual edema in face and lips   Heart: RRR. Chest: clear bil.  Breathing quietly Abdomen: soft, ND, hypoactive BS.  Slight tenderness in RUQ  Extremities: no CCE Neuro/Psych:  Oriented x 3.  Fully alert  Intake/Output from previous day: 04/03 0701 - 04/04 0700 In: 3627.1 [I.V.:3627.1] Out: -   Intake/Output this shift: Total I/O In: 175 [I.V.:175] Out: -   Lab Results:  Recent Labs  07/24/16 0538  07/24/16 1522 07/24/16 2219 07/25/16 0339  WBC 7.4  --   --   --  12.8*  HGB 12.2*  < > 16.7 15.6 14.9  HCT 33.4*  < > 46.9 43.7 41.1  PLT 126*  --   --   --  61*  < > = values in this interval not displayed. BMET  Recent Labs  07/24/16 0538 07/25/16 0339  NA 140 135  K 3.2* 5.1  CL 110 108  CO2 13* 18*  GLUCOSE 197* 144*  BUN 10 22*  CREATININE 1.73* 1.87*  CALCIUM 7.8* 7.2*   LFT  Recent Labs  07/24/16 0538 07/25/16 0339  PROT 5.1* 5.6*  ALBUMIN 3.2* 3.4*  AST 179* 2,330*  ALT 146* 1,906*  ALKPHOS 83 60  BILITOT 1.1 2.7*  BILIDIR  --  0.4  IBILI  --  2.3*   PT/INR  Recent Labs  07/24/16 1115 07/24/16 1313  LABPROT 34.5* 33.4*  INR 3.33 3.19   Hepatitis Panel No results for input(s):  HEPBSAG, HCVAB, HEPAIGM, HEPBIGM in the last 72 hours.  Studies/Results: Ct Head Wo Contrast  Result Date: 07/24/2016 CLINICAL DATA:  Altered mental status.  Hypotension. EXAM: CT HEAD WITHOUT CONTRAST TECHNIQUE: Contiguous axial images were obtained from the base of the skull through the vertex without intravenous contrast. COMPARISON:  None. FINDINGS: Brain: There is no intracranial hemorrhage, mass or evidence of acute infarction. There is no extra-axial fluid collection. Gray matter and white matter appear normal. Cerebral volume is normal for age. Brainstem and posterior fossa are unremarkable. The CSF spaces appear normal. Vascular: No hyperdense vessel or unexpected calcification. Skull: Normal. Negative for fracture or focal lesion. Sinuses/Orbits: No acute finding. Other: None. IMPRESSION: Normal brain Electronically Signed   By: Andreas Newport M.D.   On: 07/24/2016 06:17   Dg Chest Port 1 View  Result Date: 07/25/2016 CLINICAL DATA:  Shortness of breath, respiratory failure, current smoker. EXAM: PORTABLE CHEST 1 VIEW COMPARISON:  Portable chest x-ray of July 24, 2016 FINDINGS: The lungs are adequately inflated. There is no focal infiltrate. There is no pleural effusion. The heart is top-normal in size. The pulmonary vascularity  is not engorged. The mediastinum is normal in width. The bony thorax exhibits no acute abnormality. IMPRESSION: No pneumonia, CHF, nor other acute cardiopulmonary abnormality. Electronically Signed   By: David  Martinique M.D.   On: 07/25/2016 07:07   Dg Chest Port 1 View  Result Date: 07/24/2016 CLINICAL DATA:  Altered mental status.  Dyspnea this morning. EXAM: PORTABLE CHEST 1 VIEW COMPARISON:  None. FINDINGS: A single AP portable view of the chest demonstrates no focal airspace consolidation or alveolar edema. The lungs are grossly clear. There is no large effusion or pneumothorax. Cardiac and mediastinal contours appear unremarkable. IMPRESSION: No active disease.  Electronically Signed   By: Andreas Newport M.D.   On: 07/24/2016 05:46   Scheduled Meds: . diphenhydrAMINE  25 mg Intravenous M6Q  . folic acid  1 mg Intravenous Daily  . insulin aspart  0-15 Units Subcutaneous Q4H  . loratadine  10 mg Oral Daily  . methylPREDNISolone (SOLU-MEDROL) injection  60 mg Intravenous Q12H  . [START ON 07/27/2016] pantoprazole  40 mg Intravenous Q12H  . potassium chloride  20 mEq Oral Once  . thiamine  100 mg Intravenous Daily   Continuous Infusions: . sodium chloride 125 mL/hr at 07/25/16 0800  . octreotide  (SANDOSTATIN)    IV infusion 50 mcg/hr (07/25/16 0800)  . pantoprozole (PROTONIX) infusion 8 mg/hr (07/25/16 0800)   PRN Meds:.sodium chloride, diphenhydrAMINE, hydrALAZINE, ondansetron (ZOFRAN) IV   ASSESMENT:   *  Blood tinged stool.  ? Ischemic, ? Vasculitis, ? diverticular bleed.  Hgb not affected and well WNL.  CCM has IV Protonix and octreotide drip in place for ? PUD and ? Variceal bleeding.   *  Coagulopathy.  Repeat coags are in process for verification of true coagulopathy.    *  Transaminitis with AST >> ALT.  Marked rise in 24 hours, well beyond level of ETOH hepatitis.   Suspect shock liver.    *  Thrombocytopenia. Worsening.   *  Anaphylaxis.  Improved with Solumedrol, Benadryl and epinephrine.  Patient with history of red dye allergy, so ? inadvertent consumption of red dye?  By patient's report, he has had similar reactions when he was not able to confirm any recent consumption of red dye so question is there something else that's going on with him systemically causing these reactions? Continues on IV Solumedrol.    *  Elevated troponins   *  AKI     PLAN   *  Coags. ammonia now.  CBC (for platelet assay) this afternoon and in AM.    *  Ultrasound, hopefully this AM prior to EGD/Flex.     Azucena Freed  07/25/2016, 8:17 AM Pager: [phone]   Attending physician's note   I have taken an interval history, reviewed the  chart and examined the patient. I agree with the Advanced Practitioner's note, impression and recommendations.  Elevated transaminases likely secondary to ischemic liver injury with shock liver. Follow up INR.  Will proceed with EGD and flex sig .  Damaris Hippo, MD [phone] Mon-Fri 8a-5p [phone] after 5p, weekends, holidays

## 2016-07-25 NOTE — Progress Notes (Signed)
eLink Physician-Brief Progress Note Patient Name: Parker Nguyen DOB: 1965-06-13 MRN: [id number]   Date of Service  07/25/2016  HPI/Events of Note  Multiple issues: 1. Pain at epinephrine injection site R thigh. Creat = 1.97 and AST/ALT in 2000 range and 2. Advance diet?  eICU Interventions  Will order: 1. Fentanyl 12.5 mcg IV X 1.  2. K Pad to right thigh. 3. Advance diet to carb modified clear liquids.      Intervention Category Intermediate Interventions: Pain - evaluation and management  Ananth Fiallos Eugene 07/25/2016, 8:16 PM

## 2016-07-25 NOTE — Interval H&P Note (Signed)
History and Physical Interval Note:  07/25/2016 12:04 PM  Parker Nguyen  has presented today for surgery, with the diagnosis of Blood in stool. Question liver disease.  The various methods of treatment have been discussed with the patient and family. After consideration of risks, benefits and other options for treatment, the patient has consented to  Procedure(s): ESOPHAGOGASTRODUODENOSCOPY (EGD) WITH PROPOFOL (N/A) FLEXIBLE SIGMOIDOSCOPY (N/A) as a surgical intervention .  The patient's history has been reviewed, patient examined, no change in status, stable for surgery.  I have reviewed the patient's chart and labs.  Questions were answered to the patient's satisfaction.     Kavitha Nandigam

## 2016-07-25 NOTE — Progress Notes (Signed)
Daily Rounding Note  07/25/2016, 8:17 AM  LOS: 1 day   SUBJECTIVE:   Chief complaint: pink tinged bloody stools, multiple overnight.  No abd pain.  No n/v.  Feels under the weather but no trouble breathing    OBJECTIVE:         Vital signs in last 24 hours:    Temp:  [97.8 F (36.6 C)-98.4 F (36.9 C)] 98.3 F (36.8 C) (04/04 0805) Pulse Rate:  [79-93] 79 (04/04 0800) Resp:  [5-29] 14 (04/04 0800) BP: (131-172)/(87-112) 163/105 (04/04 0800) SpO2:  [91 %-100 %] 98 % (04/04 0800) Weight:  [77.9 kg (171 lb 11.8 oz)] 77.9 kg (171 lb 11.8 oz) (04/03 0915) Last BM Date: 07/25/16 Filed Weights   07/24/16 0915  Weight: 77.9 kg (171 lb 11.8 oz)   General: pleasant, alert, comfortable.  Still with residual edema in face and lips   Heart: RRR. Chest: clear bil.  Breathing quietly Abdomen: soft, ND, hypoactive BS.  Slight tenderness in RUQ  Extremities: no CCE Neuro/Psych:  Oriented x 3.  Fully alert  Intake/Output from previous day: 04/03 0701 - 04/04 0700 In: 3627.1 [I.V.:3627.1] Out: -   Intake/Output this shift: Total I/O In: 175 [I.V.:175] Out: -   Lab Results:  Recent Labs  07/24/16 0538  07/24/16 1522 07/24/16 2219 07/25/16 0339  WBC 7.4  --   --   --  12.8*  HGB 12.2*  < > 16.7 15.6 14.9  HCT 33.4*  < > 46.9 43.7 41.1  PLT 126*  --   --   --  61*  < > = values in this interval not displayed. BMET  Recent Labs  07/24/16 0538 07/25/16 0339  NA 140 135  K 3.2* 5.1  CL 110 108  CO2 13* 18*  GLUCOSE 197* 144*  BUN 10 22*  CREATININE 1.73* 1.87*  CALCIUM 7.8* 7.2*   LFT  Recent Labs  07/24/16 0538 07/25/16 0339  PROT 5.1* 5.6*  ALBUMIN 3.2* 3.4*  AST 179* 2,330*  ALT 146* 1,906*  ALKPHOS 83 60  BILITOT 1.1 2.7*  BILIDIR  --  0.4  IBILI  --  2.3*   PT/INR  Recent Labs  07/24/16 1115 07/24/16 1313  LABPROT 34.5* 33.4*  INR 3.33 3.19   Hepatitis Panel No results for input(s):  HEPBSAG, HCVAB, HEPAIGM, HEPBIGM in the last 72 hours.  Studies/Results: Ct Head Wo Contrast  Result Date: 07/24/2016 CLINICAL DATA:  Altered mental status.  Hypotension. EXAM: CT HEAD WITHOUT CONTRAST TECHNIQUE: Contiguous axial images were obtained from the base of the skull through the vertex without intravenous contrast. COMPARISON:  None. FINDINGS: Brain: There is no intracranial hemorrhage, mass or evidence of acute infarction. There is no extra-axial fluid collection. Gray matter and white matter appear normal. Cerebral volume is normal for age. Brainstem and posterior fossa are unremarkable. The CSF spaces appear normal. Vascular: No hyperdense vessel or unexpected calcification. Skull: Normal. Negative for fracture or focal lesion. Sinuses/Orbits: No acute finding. Other: None. IMPRESSION: Normal brain Electronically Signed   By: Andreas Newport M.D.   On: 07/24/2016 06:17   Dg Chest Port 1 View  Result Date: 07/25/2016 CLINICAL DATA:  Shortness of breath, respiratory failure, current smoker. EXAM: PORTABLE CHEST 1 VIEW COMPARISON:  Portable chest x-ray of July 24, 2016 FINDINGS: The lungs are adequately inflated. There is no focal infiltrate. There is no pleural effusion. The heart is top-normal in size. The pulmonary vascularity  is not engorged. The mediastinum is normal in width. The bony thorax exhibits no acute abnormality. IMPRESSION: No pneumonia, CHF, nor other acute cardiopulmonary abnormality. Electronically Signed   By: David  Martinique M.D.   On: 07/25/2016 07:07   Dg Chest Port 1 View  Result Date: 07/24/2016 CLINICAL DATA:  Altered mental status.  Dyspnea this morning. EXAM: PORTABLE CHEST 1 VIEW COMPARISON:  None. FINDINGS: A single AP portable view of the chest demonstrates no focal airspace consolidation or alveolar edema. The lungs are grossly clear. There is no large effusion or pneumothorax. Cardiac and mediastinal contours appear unremarkable. IMPRESSION: No active disease.  Electronically Signed   By: Andreas Newport M.D.   On: 07/24/2016 05:46   Scheduled Meds: . diphenhydrAMINE  25 mg Intravenous D1V  . folic acid  1 mg Intravenous Daily  . insulin aspart  0-15 Units Subcutaneous Q4H  . loratadine  10 mg Oral Daily  . methylPREDNISolone (SOLU-MEDROL) injection  60 mg Intravenous Q12H  . [START ON 07/27/2016] pantoprazole  40 mg Intravenous Q12H  . potassium chloride  20 mEq Oral Once  . thiamine  100 mg Intravenous Daily   Continuous Infusions: . sodium chloride 125 mL/hr at 07/25/16 0800  . octreotide  (SANDOSTATIN)    IV infusion 50 mcg/hr (07/25/16 0800)  . pantoprozole (PROTONIX) infusion 8 mg/hr (07/25/16 0800)   PRN Meds:.sodium chloride, diphenhydrAMINE, hydrALAZINE, ondansetron (ZOFRAN) IV   ASSESMENT:   *  Blood tinged stool.  ? Ischemic, ? Vasculitis, ? diverticular bleed.  Hgb not affected and well WNL.  CCM has IV Protonix and octreotide drip in place for ? PUD and ? Variceal bleeding.   *  Coagulopathy.  Repeat coags are in process for verification of true coagulopathy.    *  Transaminitis with AST >> ALT.  Marked rise in 24 hours, well beyond level of ETOH hepatitis.   Suspect shock liver.    *  Thrombocytopenia. Worsening.   *  Anaphylaxis.  Improved with Solumedrol, Benadryl and epinephrine.  Patient with history of red dye allergy, so ? inadvertent consumption of red dye?  By patient's report, he has had similar reactions when he was not able to confirm any recent consumption of red dye so question is there something else that's going on with him systemically causing these reactions? Continues on IV Solumedrol.    *  Elevated troponins   *  AKI     PLAN   *  Coags. ammonia now.  CBC (for platelet assay) this afternoon and in AM.    *  Ultrasound, hopefully this AM prior to EGD/Flex.     Azucena Freed  07/25/2016, 8:17 AM Pager: [phone]   Attending physician's note   I have taken an interval history, reviewed the  chart and examined the patient. I agree with the Advanced Practitioner's note, impression and recommendations.  Elevated transaminases likely secondary to ischemic liver injury with shock liver. Follow up INR.  Will proceed with EGD and flex sig .  Damaris Hippo, MD [phone] Mon-Fri 8a-5p [phone] after 5p, weekends, holidays

## 2016-07-25 NOTE — Anesthesia Preprocedure Evaluation (Signed)
Anesthesia Evaluation  Patient identified by MRN, date of birth, ID band Patient awake    Reviewed: Allergy & Precautions, NPO status , Patient's Chart, lab work & pertinent test results  Airway Mallampati: III  TM Distance: >3 FB Neck ROM: Full    Dental no notable dental hx.    Pulmonary Current Smoker,    Pulmonary exam normal breath sounds clear to auscultation       Cardiovascular negative cardio ROS Normal cardiovascular exam Rhythm:Regular Rate:Normal     Neuro/Psych negative neurological ROS  negative psych ROS   GI/Hepatic negative GI ROS, (+)     substance abuse  alcohol use,   Endo/Other  negative endocrine ROS  Renal/GU negative Renal ROS     Musculoskeletal negative musculoskeletal ROS (+)   Abdominal   Peds  Hematology negative hematology ROS (+)   Anesthesia Other Findings   Reproductive/Obstetrics negative OB ROS                             Anesthesia Physical Anesthesia Plan  ASA: III  Anesthesia Plan: MAC   Post-op Pain Management:    Induction: Intravenous  Airway Management Planned:   Additional Equipment:   Intra-op Plan:   Post-operative Plan:   Informed Consent: I have reviewed the patients History and Physical, chart, labs and discussed the procedure including the risks, benefits and alternatives for the proposed anesthesia with the patient or authorized representative who has indicated his/her understanding and acceptance.   Dental advisory given  Plan Discussed with: CRNA  Anesthesia Plan Comments:         Anesthesia Quick Evaluation

## 2016-07-26 ENCOUNTER — Encounter (HOSPITAL_COMMUNITY): Payer: Self-pay | Admitting: Gastroenterology

## 2016-07-26 ENCOUNTER — Inpatient Hospital Stay (HOSPITAL_COMMUNITY): Payer: BLUE CROSS/BLUE SHIELD

## 2016-07-26 ENCOUNTER — Other Ambulatory Visit (HOSPITAL_COMMUNITY): Payer: BLUE CROSS/BLUE SHIELD

## 2016-07-26 DIAGNOSIS — K9181 Other intraoperative complications of digestive system: Secondary | ICD-10-CM

## 2016-07-26 DIAGNOSIS — K2901 Acute gastritis with bleeding: Secondary | ICD-10-CM

## 2016-07-26 LAB — COMPREHENSIVE METABOLIC PANEL
ALBUMIN: 3 g/dL — AB (ref 3.5–5.0)
ALK PHOS: 58 U/L (ref 38–126)
ALT: 2380 U/L — AB (ref 17–63)
AST: 1883 U/L — AB (ref 15–41)
Anion gap: 8 (ref 5–15)
BUN: 24 mg/dL — AB (ref 6–20)
CALCIUM: 7.9 mg/dL — AB (ref 8.9–10.3)
CO2: 20 mmol/L — AB (ref 22–32)
CREATININE: 1.96 mg/dL — AB (ref 0.61–1.24)
Chloride: 108 mmol/L (ref 101–111)
GFR calc Af Amer: 44 mL/min — ABNORMAL LOW (ref >60)
GFR calc non Af Amer: 38 mL/min — ABNORMAL LOW (ref >60)
GLUCOSE: 145 mg/dL — AB (ref 65–99)
Potassium: 4.3 mmol/L (ref 3.5–5.1)
SODIUM: 136 mmol/L (ref 135–145)
Total Bilirubin: 3.3 mg/dL — ABNORMAL HIGH (ref 0.3–1.2)
Total Protein: 5.3 g/dL — ABNORMAL LOW (ref 6.5–8.1)

## 2016-07-26 LAB — GLUCOSE, CAPILLARY
GLUCOSE-CAPILLARY: 127 mg/dL — AB (ref 65–99)
GLUCOSE-CAPILLARY: 128 mg/dL — AB (ref 65–99)
GLUCOSE-CAPILLARY: 137 mg/dL — AB (ref 65–99)
GLUCOSE-CAPILLARY: 141 mg/dL — AB (ref 65–99)
Glucose-Capillary: 140 mg/dL — ABNORMAL HIGH (ref 65–99)
Glucose-Capillary: 129 mg/dL — ABNORMAL HIGH (ref 65–99)

## 2016-07-26 LAB — CBC
HCT: 35.5 % — ABNORMAL LOW (ref 39.0–52.0)
HEMOGLOBIN: 12.6 g/dL — AB (ref 13.0–17.0)
MCH: 31.5 pg (ref 26.0–34.0)
MCHC: 35.5 g/dL (ref 30.0–36.0)
MCV: 88.8 fL (ref 78.0–100.0)
Platelets: 52 10*3/uL — ABNORMAL LOW (ref 150–400)
RBC: 4 MIL/uL — AB (ref 4.22–5.81)
RDW: 13.3 % (ref 11.5–15.5)
WBC: 6.4 10*3/uL (ref 4.0–10.5)

## 2016-07-26 LAB — BILIRUBIN, FRACTIONATED(TOT/DIR/INDIR)
Bilirubin, Direct: 0.5 mg/dL (ref 0.1–0.5)
Indirect Bilirubin: 2.5 mg/dL — ABNORMAL HIGH (ref 0.3–0.9)
Total Bilirubin: 3 mg/dL — ABNORMAL HIGH (ref 0.3–1.2)

## 2016-07-26 MED ORDER — GADOBENATE DIMEGLUMINE 529 MG/ML IV SOLN
15.0000 mL | Freq: Once | INTRAVENOUS | Status: AC
  Administered 2016-07-26: 15 mL via INTRAVENOUS

## 2016-07-26 MED ORDER — FOLIC ACID 1 MG PO TABS
1.0000 mg | ORAL_TABLET | Freq: Every day | ORAL | Status: DC
  Administered 2016-07-27: 1 mg via ORAL
  Filled 2016-07-26: qty 1

## 2016-07-26 MED ORDER — PANTOPRAZOLE SODIUM 40 MG PO TBEC
40.0000 mg | DELAYED_RELEASE_TABLET | Freq: Every day | ORAL | Status: DC
  Administered 2016-07-26 – 2016-07-27 (×2): 40 mg via ORAL
  Filled 2016-07-26 (×2): qty 1

## 2016-07-26 MED ORDER — VITAMIN B-1 100 MG PO TABS
100.0000 mg | ORAL_TABLET | Freq: Every day | ORAL | Status: DC
  Administered 2016-07-27: 100 mg via ORAL
  Filled 2016-07-26: qty 1

## 2016-07-26 MED ORDER — METHYLPREDNISOLONE SODIUM SUCC 125 MG IJ SOLR
60.0000 mg | Freq: Two times a day (BID) | INTRAMUSCULAR | Status: AC
  Administered 2016-07-26: 60 mg via INTRAVENOUS
  Filled 2016-07-26: qty 2

## 2016-07-26 NOTE — Progress Notes (Signed)
Called report to [place]

## 2016-07-26 NOTE — Progress Notes (Signed)
Daily Rounding Note  07/26/2016, 8:14 AM  LOS: 2 days   SUBJECTIVE:   Chief complaint: bloody stool.  Stools still a little bloody.  Very mild tenderness in RLQ.  No nausea.   Ready to advance diet, tolerating clears.  Has painless, dark swelling underneath tongue.      OBJECTIVE:         Vital signs in last 24 hours:    Temp:  [97.7 F (36.5 C)-99.6 F (37.6 C)] 99 F (37.2 C) (04/05 0700) Pulse Rate:  [69-95] 75 (04/05 0700) Resp:  [16-26] 16 (04/05 0200) BP: (132-167)/(83-108) 136/86 (04/05 0700) SpO2:  [91 %-100 %] 97 % (04/05 0700) Weight:  [77.9 kg (171 lb 11.8 oz)] 77.9 kg (171 lb 11.8 oz) (04/05 0500) Last BM Date: 07/25/16 Filed Weights   07/24/16 0915 07/25/16 1140 07/26/16 0500  Weight: 77.9 kg (171 lb 11.8 oz) 77.9 kg (171 lb 11.8 oz) 77.9 kg (171 lb 11.8 oz)   General: pleasant, still slightly swollen in face ENT: dark, liver colored symmetric swollen lesion about 2 x 2 cm at base of tongue   Heart: RRR Chest: clear bil.  No cough or dyspnea Abdomen: soft, BS hypoactive.  Minor RLQ tenderness  Extremities: no CCE in legs Neuro/Psych:  Oriented x 3.  Pleasant, calm.  Fully alert.  No gross deficits.   Intake/Output from previous day: 04/04 0701 - 04/05 0700 In: 3142.5 [I.V.:3142.5] Out: 702 [Urine:701; Stool:1]  Intake/Output this shift: No intake/output data recorded.  Lab Results:  Recent Labs  07/25/16 0339 07/25/16 1357 07/25/16 1542 07/26/16 0255  WBC 12.8*  --  7.4 6.4  HGB 14.9  --  13.8 12.6*  HCT 41.1  --  38.2* 35.5*  PLT 61* 49* 50* 52*   BMET  Recent Labs  07/24/16 0538 07/25/16 0339 07/26/16 0255  NA 140 135 136  K 3.2* 5.1 4.3  CL 110 108 108  CO2 13* 18* 20*  GLUCOSE 197* 144* 145*  BUN 10 22* 24*  CREATININE 1.73* 1.87* 1.96*  CALCIUM 7.8* 7.2* 7.9*   LFT  Recent Labs  07/24/16 0538 07/25/16 0339 07/26/16 0255  PROT 5.1* 5.6* 5.3*  ALBUMIN 3.2* 3.4*  3.0*  AST 179* 2,330* 1,883*  ALT 146* 1,906* 2,380*  ALKPHOS 83 60 58  BILITOT 1.1 2.7* 3.3*  BILIDIR  --  0.4  --   IBILI  --  2.3*  --    PT/INR  Recent Labs  07/24/16 1313 07/25/16 1357  LABPROT 33.4* 18.9*  INR 3.19 1.57   Hepatitis Panel No results for input(s): HEPBSAG, HCVAB, HEPAIGM, HEPBIGM in the last 72 hours.  Studies/Results: US Abdomen Limited  Result Date: 07/25/2016 CLINICAL DATA:  Alcohol abuse, elevated liver function test EXAM: US ABDOMEN LIMITED - RIGHT UPPER QUADRANT COMPARISON:  None. FINDINGS: Gallbladder: No cholelithiasis. Edematous thickened gallbladder wall measuring 6.9 mm. No significant pericholecystic fluid. No sonographic Murphy sign noted by sonographer. Common bile duct: Diameter: 5 mm Liver: No focal hepatic mass. 3.6 x 2.8 x 2.3 cm hyperechoic right hepatic mass. 3.4 x 3.4 x 3.5 cm hyperechoic right hepatic mass. Heterogeneous hepatic echotexture. Other: Increased right renal cortical echogenicity as can be seen with medical renal disease. IMPRESSION: 1. No cholelithiasis. Edematous thickened gallbladder wall measuring 6.9 mm likely reactive secondary to hepatocellular disease versus less likely intrinsic gallbladder abnormality such as acalculus cholecystitis. 2. Two hyperechoic right hepatic masses likely reflecting hemangiomas, but given the underlying  liver heterogeneity, recommend further evaluation with an MRI of the abdomen. 3. Increased right renal cortical echogenicity as can be seen with medical renal disease. Correlate with renal function. Electronically Signed   By: Kathreen Devoid   On: 07/25/2016 10:10   Dg Chest Port 1 View  Result Date: 07/25/2016 CLINICAL DATA:  Shortness of breath, respiratory failure, current smoker. EXAM: PORTABLE CHEST 1 VIEW COMPARISON:  Portable chest x-ray of July 24, 2016 FINDINGS: The lungs are adequately inflated. There is no focal infiltrate. There is no pleural effusion. The heart is top-normal in size. The  pulmonary vascularity is not engorged. The mediastinum is normal in width. The bony thorax exhibits no acute abnormality. IMPRESSION: No pneumonia, CHF, nor other acute cardiopulmonary abnormality. Electronically Signed   By: David  Martinique M.D.   On: 07/25/2016 07:07    ASSESMENT:   * Blood tinged stool.  Hgb not affected and well WNL.  CCM has IV Protonix and octreotide drip in place for ? PUD and ? Variceal bleeding.  4/5 Flex sig.  Rectosigmoid mucosal ulceration, likely ischemic injury.   4/5 EGD.  Friable gastric and duodenal musosa.  Bleeding in duodenum.  All likely due to ischemia.   Hgb relatively unaffected by bleeding.   * Coagulopathy. Improved.  INR 3.3 >> 1.5.  Elevated D dimer. Has not received Vitamin K or FFP.   * Shock liver. LFTs not yet trending improved.  Ultrasound with 2 lesions, likely hemangiomas, GB edema likley reactive to shock liver. MRI rec to eval liver lesions.   *  Painless, subglottic swelling/mass.  Apparently ENT to be called.   *  Thrombocytopenia.   * Anaphylaxis. Improved with Solumedrol, Benadryl and epinephrine. Patient with history of red dye allergy, so ?inadvertent consumption of red dye? By patient's report, he has had similar reactions when he was not able to confirm any recent consumption of red dye so question is there something else that's going on with him systemically causing these reactions? Continues on IV Solumedrol.    * Elevated troponins.  Likely demand ischemia.    *  AKI.      PLAN   *  Supportive care per CCM.    *  Given ischemic etiology to GI bleeding, switching to oral PPI, stopping the IV drip.   *  Carb mod diet after MRI.    *   MRI ordered, pt leaving unit soon for this.   EGD ~ 10/25/2016 to assess for healing.    *  CMET, PT/INR in AM.      Azucena Freed  07/26/2016, 8:14 AM Pager: [phone]   Attending physician's note   I have taken an interval history, reviewed the chart and examined the  patient. I agree with the Advanced Practitioner's note, impression and recommendations.  Patient appears to have ischemic injury to gut with diffuse patchy ulceration of the stomach, proximal small intestine that was visualized as well as of large intestine and also ischemic hepatitis. Coagula and low platelet count could be secondary to DIC. No evidence of portal hypertension on EGD. We will perform repeat EGD in 2-3 months to document healing and he will also be due for screening colonoscopy for colorectal cancer, schedule for colonoscopy at the time. Heterogenicity of the liver parenchyma is thought to be secondary to ischemic liver injury. Patient is likely not cirrhotic yet . We'll arrange for follow-up in GI office .  We'll sign off, please call with any questions  K Denzil Magnuson,  MD [phone] Mon-Fri 8a-5p [phone] after 5p, weekends, holidays
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
## 2016-07-27 ENCOUNTER — Other Ambulatory Visit (HOSPITAL_COMMUNITY): Payer: BLUE CROSS/BLUE SHIELD

## 2016-07-27 LAB — CBC
HEMATOCRIT: 35.5 % — AB (ref 39.0–52.0)
HEMOGLOBIN: 12.6 g/dL — AB (ref 13.0–17.0)
MCH: 31.4 pg (ref 26.0–34.0)
MCHC: 35.5 g/dL (ref 30.0–36.0)
MCV: 88.5 fL (ref 78.0–100.0)
Platelets: 59 10*3/uL — ABNORMAL LOW (ref 150–400)
RBC: 4.01 MIL/uL — ABNORMAL LOW (ref 4.22–5.81)
RDW: 13.4 % (ref 11.5–15.5)
WBC: 10 10*3/uL (ref 4.0–10.5)

## 2016-07-27 LAB — COMPREHENSIVE METABOLIC PANEL
ALBUMIN: 3.1 g/dL — AB (ref 3.5–5.0)
ALT: 2089 U/L — ABNORMAL HIGH (ref 17–63)
ANION GAP: 8 (ref 5–15)
AST: 815 U/L — ABNORMAL HIGH (ref 15–41)
Alkaline Phosphatase: 76 U/L (ref 38–126)
BILIRUBIN TOTAL: 2.3 mg/dL — AB (ref 0.3–1.2)
BUN: 25 mg/dL — ABNORMAL HIGH (ref 6–20)
CHLORIDE: 105 mmol/L (ref 101–111)
CO2: 24 mmol/L (ref 22–32)
Calcium: 8.4 mg/dL — ABNORMAL LOW (ref 8.9–10.3)
Creatinine, Ser: 1.55 mg/dL — ABNORMAL HIGH (ref 0.61–1.24)
GFR calc Af Amer: 59 mL/min — ABNORMAL LOW (ref >60)
GFR, EST NON AFRICAN AMERICAN: 51 mL/min — AB (ref >60)
GLUCOSE: 125 mg/dL — AB (ref 65–99)
POTASSIUM: 4.1 mmol/L (ref 3.5–5.1)
Sodium: 137 mmol/L (ref 135–145)
TOTAL PROTEIN: 5.9 g/dL — AB (ref 6.5–8.1)

## 2016-07-27 LAB — PROTIME-INR
INR: 1.13
Prothrombin Time: 14.5 seconds (ref 11.4–15.2)

## 2016-07-27 LAB — GLUCOSE, CAPILLARY
Glucose-Capillary: 111 mg/dL — ABNORMAL HIGH (ref 65–99)
Glucose-Capillary: 120 mg/dL — ABNORMAL HIGH (ref 65–99)
Glucose-Capillary: 126 mg/dL — ABNORMAL HIGH (ref 65–99)
Glucose-Capillary: 132 mg/dL — ABNORMAL HIGH (ref 65–99)

## 2016-07-27 LAB — HEPATITIS PANEL, ACUTE
HEP B S AG: NEGATIVE
Hep A IgM: NEGATIVE
Hep B C IgM: NEGATIVE

## 2016-07-27 MED ORDER — EPINEPHRINE 0.3 MG/0.3ML IJ SOAJ
0.3000 mg | Freq: Once | INTRAMUSCULAR | Status: AC
Start: 2016-07-27 — End: 2016-07-27
  Administered 2016-07-27: 0.3 mg via SUBCUTANEOUS
  Filled 2016-07-27: qty 0.3

## 2016-07-27 MED ORDER — LORATADINE 10 MG PO TABS: 10.0000 mg | ORAL_TABLET | Freq: Every day | ORAL | 2 refills | Status: DC

## 2016-07-27 MED ORDER — PANTOPRAZOLE SODIUM 40 MG PO TBEC: 40.0000 mg | DELAYED_RELEASE_TABLET | Freq: Every day | ORAL | 4 refills | Status: DC

## 2016-07-27 MED ORDER — EPINEPHRINE 0.3 MG/0.3ML IJ SOAJ: 0.3000 mg | Freq: Once | INTRAMUSCULAR | 0 refills | Status: AC

## 2016-07-27 NOTE — Progress Notes (Signed)
Reviewed discharge instructions with pt and family. Pt verbalized an understanding and left unit with family and all belongings.

## 2016-07-27 NOTE — Discharge Instructions (Signed)
No smoking or Alcohol. Take Protonix 40 mg daily. Take Claritin as ordered. Follow up with Tammy Parrett ANP as scheduled.  Follow up with ENT MD and GI MD as instructed.  Use Epi Pen as instructed  May return to work as light duty on 07/31/16
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
## 2016-07-29 LAB — CULTURE, BLOOD (ROUTINE X 2)
CULTURE: NO GROWTH
Culture: NO GROWTH
SPECIAL REQUESTS: ADEQUATE
SPECIAL REQUESTS: ADEQUATE

## 2016-07-30 ENCOUNTER — Encounter: Payer: Self-pay | Admitting: Adult Health

## 2016-08-07 ENCOUNTER — Telehealth: Payer: Self-pay | Admitting: Adult Health

## 2016-08-07 NOTE — Telephone Encounter (Signed)
Spoke with pt, who is scheduled for hosp f/u with TP on 08/08/16 @ 11:30. Pt asked if any labs or test would be performed during this OV, if not he asked if this could be a phone visit. I explained that no test have been ordered as of yet. I also explained to pt that our providers typically recommend a face to face visit for hosp f/u for better evaluation, but I would get a message over to the provider.  TP please advise. Thanks.

## 2016-08-08 ENCOUNTER — Ambulatory Visit (INDEPENDENT_AMBULATORY_CARE_PROVIDER_SITE_OTHER): Payer: BLUE CROSS/BLUE SHIELD | Admitting: Adult Health

## 2016-08-08 ENCOUNTER — Encounter: Payer: Self-pay | Admitting: Adult Health

## 2016-08-08 VITALS — BP 124/86 | HR 83 | Ht 68.0 in | Wt 170.0 lb

## 2016-08-08 DIAGNOSIS — T782XXD Anaphylactic shock, unspecified, subsequent encounter: Secondary | ICD-10-CM | POA: Diagnosis not present

## 2016-08-08 DIAGNOSIS — T782XXA Anaphylactic shock, unspecified, initial encounter: Secondary | ICD-10-CM

## 2016-08-08 DIAGNOSIS — K2901 Acute gastritis with bleeding: Secondary | ICD-10-CM | POA: Diagnosis not present

## 2016-08-08 NOTE — Assessment & Plan Note (Signed)
Anaphylaxis w/ shock ? Etiology ? Red dye reaction (unclear )  He is improving  Pt education on Epi Pen  Needs follow up with GI, Allergy and ENT   Plan Patient Instructions  Need follow up with Allergist - Follow up with GI as planned in May  . Follow up with ENT as recommended -Dr. Constance Holster  Follow up with Pulmonary as needed.  Advance activity as tolerated.  Please contact office for sooner follow up if symptoms do not improve or worsen or seek emergency care

## 2016-08-08 NOTE — Assessment & Plan Note (Signed)
Follow-up with GI 
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
## 2016-08-08 NOTE — Patient Instructions (Addendum)
Need follow up with Allergist - Follow up with GI as planned in May  . Follow up with ENT as recommended -Dr. Constance Holster  Follow up with Pulmonary as needed.  Advance activity as tolerated.  Please contact office for sooner follow up if symptoms do not improve or worsen or seek emergency care

## 2016-08-10 NOTE — Telephone Encounter (Signed)
Pt was seen by TP on 4/18.  Will close this message.

## 2016-08-15 DIAGNOSIS — J3081 Allergic rhinitis due to animal (cat) (dog) hair and dander: Secondary | ICD-10-CM | POA: Diagnosis not present

## 2016-08-15 DIAGNOSIS — H1045 Other chronic allergic conjunctivitis: Secondary | ICD-10-CM | POA: Diagnosis not present

## 2016-08-15 DIAGNOSIS — R21 Rash and other nonspecific skin eruption: Secondary | ICD-10-CM | POA: Diagnosis not present

## 2016-08-15 DIAGNOSIS — T781XXD Other adverse food reactions, not elsewhere classified, subsequent encounter: Secondary | ICD-10-CM | POA: Diagnosis not present

## 2016-08-21 ENCOUNTER — Encounter: Payer: Self-pay | Admitting: Adult Health

## 2016-08-30 ENCOUNTER — Ambulatory Visit: Payer: BLUE CROSS/BLUE SHIELD | Admitting: Gastroenterology

## 2016-09-07 ENCOUNTER — Encounter: Payer: Self-pay | Admitting: Nurse Practitioner

## 2016-09-07 ENCOUNTER — Ambulatory Visit (INDEPENDENT_AMBULATORY_CARE_PROVIDER_SITE_OTHER): Payer: BLUE CROSS/BLUE SHIELD | Admitting: Nurse Practitioner

## 2016-09-07 ENCOUNTER — Other Ambulatory Visit (INDEPENDENT_AMBULATORY_CARE_PROVIDER_SITE_OTHER): Payer: BLUE CROSS/BLUE SHIELD

## 2016-09-07 VITALS — BP 130/70 | HR 68 | Ht 69.0 in | Wt 171.0 lb

## 2016-09-07 DIAGNOSIS — K559 Vascular disorder of intestine, unspecified: Secondary | ICD-10-CM

## 2016-09-07 DIAGNOSIS — K7200 Acute and subacute hepatic failure without coma: Secondary | ICD-10-CM | POA: Diagnosis not present

## 2016-09-07 LAB — COMPREHENSIVE METABOLIC PANEL
ALBUMIN: 4.4 g/dL (ref 3.5–5.2)
ALK PHOS: 74 U/L (ref 39–117)
ALT: 53 U/L (ref 0–53)
AST: 35 U/L (ref 0–37)
BUN: 10 mg/dL (ref 6–23)
CALCIUM: 9.1 mg/dL (ref 8.4–10.5)
CHLORIDE: 103 meq/L (ref 96–112)
CO2: 28 mEq/L (ref 19–32)
Creatinine, Ser: 1 mg/dL (ref 0.40–1.50)
GFR: 101.72 mL/min (ref >60.00)
Glucose, Bld: 94 mg/dL (ref 70–99)
POTASSIUM: 3.9 meq/L (ref 3.5–5.1)
Sodium: 138 mEq/L (ref 135–145)
TOTAL PROTEIN: 7.1 g/dL (ref 6.0–8.3)
Total Bilirubin: 1.4 mg/dL — ABNORMAL HIGH (ref 0.2–1.2)

## 2016-09-07 LAB — CBC
HCT: 32.7 % — ABNORMAL LOW (ref 39.0–52.0)
HEMOGLOBIN: 11.3 g/dL — AB (ref 13.0–17.0)
MCHC: 34.6 g/dL (ref 30.0–36.0)
MCV: 92 fl (ref 78.0–100.0)
Platelets: 176 10*3/uL (ref 150.0–400.0)
RBC: 3.55 Mil/uL — ABNORMAL LOW (ref 4.22–5.81)
RDW: 12.7 % (ref 11.5–15.5)
WBC: 7.3 10*3/uL (ref 4.0–10.5)

## 2016-09-07 MED ORDER — NA SULFATE-K SULFATE-MG SULF 17.5-3.13-1.6 GM/177ML PO SOLN: 1.0000 | Freq: Once | ORAL | 0 refills | Status: AC

## 2016-09-07 NOTE — Patient Instructions (Addendum)
Please go to the basement level to have your labs drawn.  We will call you with the lab results.   You have been scheduled for a colonoscopy and Endoscopy. Please follow written instructions given to you at your visit today.  Please pick up your prep supplies at the pharmacy within the next 1-3 days. [facility], [place]  If you use inhalers (even only as needed), please bring them with you on the day of your procedure. Your physician has requested that you go to www.[email or web address].com and enter the access code given to you at your visit today. This web site gives a general overview about your procedure. However, you should still follow specific instructions given to you by our office regarding your preparation for the procedure.

## 2016-09-07 NOTE — Progress Notes (Addendum)
HPI:  Patient is a 51 year old male hospitalized early April after being found unresponsive at home in anaphylactic shock, possibly secondary to known red dye allergy. He was having hematochezia on admission. His transaminases were markedly elevated suggesting shock liver He was coagulopathic and in AKI. Patient had a known history of heavy ETOH. Imaging studies didn't suggest cirrhotic liver. Inpatient EGD negative for varices but revealed ischemic injury to the stomach, duodenal bulb and second portion of duodenum. Flexible sigmoidoscopy demonstrated ischemic injury to the rectosigmoid colon. At time of discharge AST / ALT was 815 / 2000. Coagulopathy had resolved. Patient is here for hospital follow. He feels okay. No further GI bleeding.   He has started getting intermittent headaches since discharge. No visual disturances. Headaches usually left sided. He hasn't had any ETOH since discharge.    Past Medical History:  Diagnosis Date  . Alcohol use    Past Surgical History:  Procedure Laterality Date  . ESOPHAGOGASTRODUODENOSCOPY (EGD) WITH PROPOFOL N/A 07/25/2016   Procedure: ESOPHAGOGASTRODUODENOSCOPY (EGD) WITH PROPOFOL;  Surgeon: Mauri Pole, MD;  Location: [facility];  Service: Endoscopy;  Laterality: N/A;  . FLEXIBLE SIGMOIDOSCOPY N/A 07/25/2016   Procedure: FLEXIBLE SIGMOIDOSCOPY;  Surgeon: Mauri Pole, MD;  Location: [facility];  Service: Endoscopy;  Laterality: N/A;  . Left arm fracture  1989   Family History  Problem Relation Age of Onset  . Cancer Mother        Unsure type  . Hypertension Mother   . Diabetes Mother   . Congestive Heart Failure Mother   . Heart disease Father   . Kidney disease Father   . Diabetes Father   . Colon polyps Father    Social History  Substance Use Topics  . Smoking status: Former Smoker    Packs/day: 0.25    Types: Cigarettes  . Smokeless tobacco: Never Used  . Alcohol use No     Comment: quit 07/24/16   Current  Outpatient Prescriptions  Medication Sig Dispense Refill  . Multiple Vitamins-Minerals (CENTRUM SILVER 50+MEN PO) Take 1 tablet by mouth daily.    . Omega-3 Fatty Acids (FISH OIL) 1000 MG CAPS Take 1 capsule by mouth daily.    . pantoprazole (PROTONIX) 40 MG tablet Take 1 tablet (40 mg total) by mouth daily at 6 (six) AM. (Patient not taking: Reported on 09/07/2016) 30 tablet 4   No current facility-administered medications for this visit.    Allergies  Allergen Reactions  . Red Dye     "N/V, chills"     Review of Systems: All systems reviewed and negative except where noted in HPI.    Physical Exam: BP 130/70   Pulse 68   Ht 5\' 9"  (1.753 m)   Wt 171 lb (77.6 kg)   BMI 25.25 kg/m  Constitutional:  Well-developed, black male in no acute distress. Psychiatric: Normal mood and affect. Behavior is normal. EENT: . Conjunctivae are normal. No scleral icterus. Neck supple.  Cardiovascular: Normal rate, regular rhythm.  Pulmonary/chest: Effort normal and breath sounds normal. No wheezing, rales or rhonchi. Abdominal: Soft, nondistended, nontender. Bowel sounds active throughout. There are no masses palpable. . Extremities: no edema Lymphadenopathy: No cervical adenopathy noted. Neurological: Alert and oriented to person place and time. Skin: Skin is warm and dry. No rashes noted.   ASSESSMENT AND PLAN: 1. 51 yo male with recent anaphylactic shock (with known red dye allergy) resulting in AKI, ischemic hepatitis as well as ischemic injury  to stomach, duodenum and rectosigmoid colon. No further bleeding.  -He needs a repeat EGD in a couple of months to document mucosal healing.   He also needs a screening colonoscopy which can be done on the same day. The risks and benefits of the procedure were discussed and the patient agrees to proceed.  -follow up LFTs today -Now carries Epipen with him at all times.   2. Hx of heavy ETOH use, none since hospital discharge. No evidence for  cirrhosis by imaging. Recent LFT abnormalities, thrombocytopenia and coagulopathy can be explained by acute events.   -repeat LFTs today.  -Reiterated detrimental effects of ETOH abuse.   Tye Savoy, NP  09/07/2016, 3:08 PM

## 2016-09-11 ENCOUNTER — Encounter: Payer: Self-pay | Admitting: Nurse Practitioner

## 2016-09-20 NOTE — Progress Notes (Signed)
Reviewed and agree with documentation and assessment and plan. K. Veena Savino Whisenant , MD   

## 2016-09-26 ENCOUNTER — Encounter: Payer: Self-pay | Admitting: Gastroenterology

## 2016-10-03 ENCOUNTER — Encounter: Payer: Self-pay | Admitting: Gastroenterology

## 2016-10-03 ENCOUNTER — Ambulatory Visit (AMBULATORY_SURGERY_CENTER): Payer: BLUE CROSS/BLUE SHIELD | Admitting: Gastroenterology

## 2016-10-03 VITALS — BP 117/82 | HR 65 | Temp 98.0°F | Resp 17 | Ht 69.0 in | Wt 166.0 lb

## 2016-10-03 DIAGNOSIS — D124 Benign neoplasm of descending colon: Secondary | ICD-10-CM | POA: Diagnosis not present

## 2016-10-03 DIAGNOSIS — Z1211 Encounter for screening for malignant neoplasm of colon: Secondary | ICD-10-CM

## 2016-10-03 DIAGNOSIS — K559 Vascular disorder of intestine, unspecified: Secondary | ICD-10-CM | POA: Diagnosis not present

## 2016-10-03 DIAGNOSIS — Z1212 Encounter for screening for malignant neoplasm of rectum: Secondary | ICD-10-CM | POA: Diagnosis not present

## 2016-10-03 DIAGNOSIS — K253 Acute gastric ulcer without hemorrhage or perforation: Secondary | ICD-10-CM | POA: Diagnosis not present

## 2016-10-03 DIAGNOSIS — K7200 Acute and subacute hepatic failure without coma: Secondary | ICD-10-CM

## 2016-10-03 MED ORDER — SODIUM CHLORIDE 0.9 % IV SOLN: 500.0000 mL | INTRAVENOUS | Status: DC

## 2016-10-03 NOTE — Progress Notes (Signed)
Called to room to assist during endoscopic procedure.  Patient ID and intended procedure confirmed with present staff. Received instructions for my participation in the procedure from the performing physician.  

## 2016-10-03 NOTE — Op Note (Signed)
[facility] Patient Name: Parker Nguyen Procedure Date: 10/03/2016 3:58 PM MRN: [id number] Endoscopist: Mauri Pole , MD Age: 51 Referring MD:  Date of Birth: [birth year] Gender: Male Account #: [id number] Procedure:                Upper GI endoscopy Indications:              Follow-up of acute gastric ulcer Medicines:                Monitored Anesthesia Care Procedure:                Pre-Anesthesia Assessment:                           - Prior to the procedure, a History and Physical                            was performed, and patient medications and                            allergies were reviewed. The patient's tolerance of                            previous anesthesia was also reviewed. The risks                            and benefits of the procedure and the sedation                            options and risks were discussed with the patient.                            All questions were answered, and informed consent                            was obtained. Prior Anticoagulants: The patient has                            taken no previous anticoagulant or antiplatelet                            agents. ASA Grade Assessment: II - A patient with                            mild systemic disease. After reviewing the risks                            and benefits, the patient was deemed in                            satisfactory condition to undergo the procedure.                           After obtaining informed consent, the endoscope was  passed under direct vision. Throughout the                            procedure, the patient's blood pressure, pulse, and                            oxygen saturations were monitored continuously. The                            Endoscope was introduced through the mouth, and                            advanced to the second part of duodenum. The upper                            GI endoscopy was  accomplished without difficulty.                            The patient tolerated the procedure well. Scope In: Scope Out: Findings:                 The esophagus was normal.                           The stomach was normal.                           The examined duodenum was normal. Complications:            No immediate complications. Estimated Blood Loss:     Estimated blood loss: none. Impression:               - Normal esophagus.                           - Normal stomach.                           - Normal examined duodenum.                           - No specimens collected. Recommendation:           - Patient has a contact number available for                            emergencies. The signs and symptoms of potential                            delayed complications were discussed with the                            patient. Return to normal activities tomorrow.                            Written discharge instructions were provided to the  patient.                           - Resume previous diet.                           - Continue present medications.                           - No repeat upper endoscopy.                           - Return to GI office PRN. Mauri Pole, MD 10/03/2016 4:37:45 PM This report has been signed electronically.

## 2016-10-03 NOTE — Op Note (Signed)
[facility] Patient Name: Parker Nguyen Procedure Date: 10/03/2016 3:57 PM MRN: [id number] Endoscopist: Mauri Pole , MD Age: 51 Referring MD:  Date of Birth: March 11, 1966 Gender: Male Account #: [id number] Procedure:                Colonoscopy Indications:              Screening for colorectal malignant neoplasm Medicines:                Monitored Anesthesia Care Procedure:                Pre-Anesthesia Assessment:                           - Prior to the procedure, a History and Physical                            was performed, and patient medications and                            allergies were reviewed. The patient's tolerance of                            previous anesthesia was also reviewed. The risks                            and benefits of the procedure and the sedation                            options and risks were discussed with the patient.                            All questions were answered, and informed consent                            was obtained. Prior Anticoagulants: The patient has                            taken no previous anticoagulant or antiplatelet                            agents. ASA Grade Assessment: II - A patient with                            mild systemic disease. After reviewing the risks                            and benefits, the patient was deemed in                            satisfactory condition to undergo the procedure.                           After obtaining informed consent, the colonoscope  was passed under direct vision. Throughout the                            procedure, the patient's blood pressure, pulse, and                            oxygen saturations were monitored continuously. The                            Colonoscope was introduced through the anus and                            advanced to the the cecum, identified by                            appendiceal orifice  and ileocecal valve. The                            colonoscopy was performed without difficulty. The                            patient tolerated the procedure well. The quality                            of the bowel preparation was adequate. The                            ileocecal valve, appendiceal orifice, and rectum                            were photographed. Scope In: 4:15:38 PM Scope Out: 4:30:49 PM Scope Withdrawal Time: 0 hours 11 minutes 54 seconds  Total Procedure Duration: 0 hours 15 minutes 11 seconds  Findings:                 The perianal and digital rectal examinations were                            normal.                           A 4 mm polyp was found in the descending colon. The                            polyp was sessile. The polyp was removed with a                            cold snare. Resection and retrieval were complete.                           A few small-mouthed diverticula were found in the                            sigmoid colon, descending colon and ascending colon.  Non-bleeding internal hemorrhoids were found during                            retroflexion. The hemorrhoids were small.                           The exam was otherwise without abnormality. Complications:            No immediate complications. Estimated Blood Loss:     Estimated blood loss was minimal. Impression:               - One 4 mm polyp in the descending colon, removed                            with a cold snare. Resected and retrieved.                           - Diverticulosis in the sigmoid colon, in the                            descending colon and in the ascending colon.                           - Non-bleeding internal hemorrhoids.                           - The examination was otherwise normal. Recommendation:           - Patient has a contact number available for                            emergencies. The signs and symptoms of potential                             delayed complications were discussed with the                            patient. Return to normal activities tomorrow.                            Written discharge instructions were provided to the                            patient.                           - Resume previous diet.                           - Continue present medications.                           - Await pathology results.                           - Repeat colonoscopy in 5-10 years for surveillance  based on pathology results.                           - Return to GI clinic PRN. Mauri Pole, MD 10/03/2016 4:39:48 PM This report has been signed electronically.

## 2016-10-03 NOTE — Progress Notes (Signed)
Pt last drank approximately 12 oz water at 1400 today.  Reported to Dr. Silverio Decamp.  Corky Sing

## 2016-10-03 NOTE — Progress Notes (Signed)
Dental advisory given to [place] and oriented x3, pleased with MAC, report to RN Judson Roch

## 2016-10-03 NOTE — Patient Instructions (Signed)
YOU HAD AN ENDOSCOPIC PROCEDURE TODAY AT [facility]:   Refer to the procedure report that was given to you for any specific questions about what was found during the examination.  If the procedure report does not answer your questions, please call your gastroenterologist to clarify.  If you requested that your care partner not be given the details of your procedure findings, then the procedure report has been included in a sealed envelope for you to review at your convenience later.  YOU SHOULD EXPECT: Some feelings of bloating in the abdomen. Passage of more gas than usual.  Walking can help get rid of the air that was put into your GI tract during the procedure and reduce the bloating. If you had a lower endoscopy (such as a colonoscopy or flexible sigmoidoscopy) you may notice spotting of blood in your stool or on the toilet paper. If you underwent a bowel prep for your procedure, you may not have a normal bowel movement for a few days.  Please Note:  You might notice some irritation and congestion in your nose or some drainage.  This is from the oxygen used during your procedure.  There is no need for concern and it should clear up in a day or so.  SYMPTOMS TO REPORT IMMEDIATELY:   Following lower endoscopy (colonoscopy or flexible sigmoidoscopy):  Excessive amounts of blood in the stool  Significant tenderness or worsening of abdominal pains  Swelling of the abdomen that is new, acute  Fever of 100F or higher   Following upper endoscopy (EGD)  Vomiting of blood or coffee ground material  New chest pain or pain under the shoulder blades  Painful or persistently difficult swallowing  New shortness of breath  Fever of 100F or higher  Black, tarry-looking stools  For urgent or emergent issues, a gastroenterologist can be reached at any hour by calling [phone].   DIET:  We do recommend a small meal at first, but then you may proceed to your regular diet.  Drink  plenty of fluids but you should avoid alcoholic beverages for 24 hours.  ACTIVITY:  You should plan to take it easy for the rest of today and you should NOT DRIVE or use heavy machinery until tomorrow (because of the sedation medicines used during the test).    FOLLOW UP: Our staff will call the number listed on your records the next business day following your procedure to check on you and address any questions or concerns that you may have regarding the information given to you following your procedure. If we do not reach you, we will leave a message.  However, if you are feeling well and you are not experiencing any problems, there is no need to return our call.  We will assume that you have returned to your regular daily activities without incident.  If any biopsies were taken you will be contacted by phone or by letter within the next 1-3 weeks.  Please call us at [phone] if you have not heard about the biopsies in 3 weeks.    SIGNATURES/CONFIDENTIALITY: You and/or your care partner have signed paperwork which will be entered into your electronic medical record.  These signatures attest to the fact that that the information above on your After Visit Summary has been reviewed and is understood.  Full responsibility of the confidentiality of this discharge information lies with you and/or your care-partner.   Resume medications. Information given on polyps,diverticulosis and hemorrhoids.

## 2016-10-04 ENCOUNTER — Telehealth: Payer: Self-pay

## 2016-10-04 NOTE — Telephone Encounter (Signed)
  Follow up Call-  Call back number 10/03/2016  Post procedure Call Back phone  # [phone] cell  Permission to leave phone message Yes  Some recent data might be hidden     Patient questions:  Do you have a fever, pain , or abdominal swelling? No. Pain Score  0 *  Have you tolerated food without any problems? Yes.    Have you been able to return to your normal activities? Yes.    Do you have any questions about your discharge instructions: Diet   No. Medications  No. Follow up visit  No.  Do you have questions or concerns about your Care? No.  Actions: * If pain score is 4 or above: No action needed, pain <4.

## 2016-10-09 ENCOUNTER — Encounter: Payer: Self-pay | Admitting: Gastroenterology

## 2016-10-13 LAB — GLUCOSE, POCT (MANUAL RESULT ENTRY): POC Glucose: 122 mg/dl — AB (ref 70–99)

## 2016-11-24 ENCOUNTER — Observation Stay (HOSPITAL_COMMUNITY)
Admission: EM | Admit: 2016-11-24 | Discharge: 2016-11-25 | Disposition: A | Discharge status: Home / Self Care | Payer: BLUE CROSS/BLUE SHIELD | Attending: Internal Medicine | Admitting: Internal Medicine

## 2016-11-24 ENCOUNTER — Encounter (HOSPITAL_COMMUNITY): Payer: Self-pay | Admitting: Emergency Medicine

## 2016-11-24 DIAGNOSIS — E876 Hypokalemia: Secondary | ICD-10-CM | POA: Insufficient documentation

## 2016-11-24 DIAGNOSIS — F17210 Nicotine dependence, cigarettes, uncomplicated: Secondary | ICD-10-CM | POA: Insufficient documentation

## 2016-11-24 DIAGNOSIS — T7840XA Allergy, unspecified, initial encounter: Secondary | ICD-10-CM | POA: Insufficient documentation

## 2016-11-24 DIAGNOSIS — L509 Urticaria, unspecified: Secondary | ICD-10-CM | POA: Diagnosis not present

## 2016-11-24 DIAGNOSIS — Z79899 Other long term (current) drug therapy: Secondary | ICD-10-CM | POA: Insufficient documentation

## 2016-11-24 DIAGNOSIS — T7809XA Anaphylactic reaction due to other food products, initial encounter: Secondary | ICD-10-CM | POA: Diagnosis not present

## 2016-11-24 DIAGNOSIS — T782XXA Anaphylactic shock, unspecified, initial encounter: Principal | ICD-10-CM

## 2016-11-24 DIAGNOSIS — Z91041 Radiographic dye allergy status: Secondary | ICD-10-CM | POA: Diagnosis not present

## 2016-11-24 DIAGNOSIS — R600 Localized edema: Secondary | ICD-10-CM | POA: Diagnosis present

## 2016-11-24 LAB — CBC WITH DIFFERENTIAL/PLATELET
Basophils Absolute: 0 10*3/uL (ref 0.0–0.1)
Basophils Relative: 0 %
EOS ABS: 0 10*3/uL (ref 0.0–0.7)
EOS PCT: 1 %
HCT: 32.9 % — ABNORMAL LOW (ref 39.0–52.0)
Hemoglobin: 11.8 g/dL — ABNORMAL LOW (ref 13.0–17.0)
LYMPHS ABS: 4.7 10*3/uL — AB (ref 0.7–4.0)
Lymphocytes Relative: 70 %
MCH: 31.3 pg (ref 26.0–34.0)
MCHC: 35.9 g/dL (ref 30.0–36.0)
MCV: 87.3 fL (ref 78.0–100.0)
MONO ABS: 0.2 10*3/uL (ref 0.1–1.0)
MONOS PCT: 3 %
Neutro Abs: 1.7 10*3/uL (ref 1.7–7.7)
Neutrophils Relative %: 26 %
PLATELETS: 203 10*3/uL (ref 150–400)
RBC: 3.77 MIL/uL — AB (ref 4.22–5.81)
RDW: 13.3 % (ref 11.5–15.5)
WBC: 6.6 10*3/uL (ref 4.0–10.5)

## 2016-11-24 LAB — COMPREHENSIVE METABOLIC PANEL
ALT: 19 U/L (ref 17–63)
AST: 35 U/L (ref 15–41)
Albumin: 3.8 g/dL (ref 3.5–5.0)
Alkaline Phosphatase: 62 U/L (ref 38–126)
Anion gap: 13 (ref 5–15)
BILIRUBIN TOTAL: 1.5 mg/dL — AB (ref 0.3–1.2)
BUN: 10 mg/dL (ref 6–20)
CHLORIDE: 106 mmol/L (ref 101–111)
CO2: 21 mmol/L — ABNORMAL LOW (ref 22–32)
Calcium: 8.6 mg/dL — ABNORMAL LOW (ref 8.9–10.3)
Creatinine, Ser: 1.44 mg/dL — ABNORMAL HIGH (ref 0.61–1.24)
GFR calc Af Amer: >60 mL/min (ref >60)
GFR, EST NON AFRICAN AMERICAN: 55 mL/min — AB (ref >60)
GLUCOSE: 156 mg/dL — AB (ref 65–99)
Potassium: 3.1 mmol/L — ABNORMAL LOW (ref 3.5–5.1)
Sodium: 140 mmol/L (ref 135–145)
Total Protein: 6.4 g/dL — ABNORMAL LOW (ref 6.5–8.1)

## 2016-11-24 MED ORDER — EPINEPHRINE 0.3 MG/0.3ML IJ SOAJ
INTRAMUSCULAR | Status: AC
  Administered 2016-11-24: 0.3 mg
  Filled 2016-11-24: qty 0.3

## 2016-11-24 MED ORDER — ACETAMINOPHEN 325 MG PO TABS: 650.0000 mg | ORAL_TABLET | Freq: Four times a day (QID) | ORAL | Status: DC | PRN

## 2016-11-24 MED ORDER — HEPARIN SODIUM (PORCINE) 5000 UNIT/ML IJ SOLN
5000.0000 [IU] | Freq: Three times a day (TID) | INTRAMUSCULAR | Status: DC
  Administered 2016-11-24 – 2016-11-25 (×2): 5000 [IU] via SUBCUTANEOUS
  Filled 2016-11-24 (×2): qty 1

## 2016-11-24 MED ORDER — ACETAMINOPHEN 650 MG RE SUPP: 650.0000 mg | Freq: Four times a day (QID) | RECTAL | Status: DC | PRN

## 2016-11-24 MED ORDER — METHYLPREDNISOLONE SODIUM SUCC 40 MG IJ SOLR
40.0000 mg | Freq: Two times a day (BID) | INTRAMUSCULAR | Status: DC
  Administered 2016-11-24 – 2016-11-25 (×2): 40 mg via INTRAVENOUS
  Filled 2016-11-24 (×2): qty 1

## 2016-11-24 MED ORDER — SODIUM CHLORIDE 0.9 % IV SOLN
Freq: Once | INTRAVENOUS | Status: AC
  Administered 2016-11-24: 18:00:00 via INTRAVENOUS

## 2016-11-24 MED ORDER — FAMOTIDINE IN NACL 20-0.9 MG/50ML-% IV SOLN
20.0000 mg | Freq: Once | INTRAVENOUS | Status: AC
  Administered 2016-11-24: 20 mg via INTRAVENOUS
  Filled 2016-11-24: qty 50

## 2016-11-24 MED ORDER — SODIUM CHLORIDE 0.9 % IV BOLUS (SEPSIS)
1000.0000 mL | Freq: Once | INTRAVENOUS | Status: AC
  Administered 2016-11-24: 1000 mL via INTRAVENOUS

## 2016-11-24 MED ORDER — DIPHENHYDRAMINE HCL 50 MG/ML IJ SOLN
25.0000 mg | Freq: Four times a day (QID) | INTRAMUSCULAR | Status: DC | PRN
Start: 2016-11-24 — End: 2016-11-25

## 2016-11-24 MED ORDER — POTASSIUM CHLORIDE CRYS ER 20 MEQ PO TBCR
40.0000 meq | EXTENDED_RELEASE_TABLET | Freq: Once | ORAL | Status: AC
  Administered 2016-11-24: 40 meq via ORAL
  Filled 2016-11-24: qty 2

## 2016-11-24 NOTE — ED Provider Notes (Signed)
[facility] DEPT Provider Note   CSN: [id number] Arrival date & time: 11/24/16  1354     History   Chief Complaint Chief Complaint  Patient presents with  . Allergic Reaction    HPI Parker Nguyen is a 51 y.o. male.  The history is provided by the patient and medical records. No language interpreter was used.   Parker Nguyen is a 51 y.o. 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 Patient states that he ate a [employer] hamburger, french fries and sweet tea at approximately 1045. About an hour later, he noticed generalized itching and redness along with difficulty breathing. He felt as if this was similar to the beginning of allergic reaction in the past. He had his EpiPen with him which he self administered then called 911. He felt better for a few minutes, but upon EMS arrival, was coughing and having difficulty breathing once again. He felt as if he was going to have diarrhea, but did not have BM. Another epi was given by EMS along with 5 mg of albuterol, 125 mg Solu-Medrol, 50 Benadryl PO. He felt as if he is improving since medications given. His only complaint at time of initial evaluation with swelling to bilateral eyelids. He states that this was all he has eaten today and he has eaten these things in the past with no reaction. He denies any new lotions, detergents, soaps, etc. No recent outdoor activity / camping / hiking. No known bug bites.  Unsure of trigger.   Past Medical History:  Diagnosis Date  . Alcohol use   . 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 Hypokalemia 11/24/2016  . Ischemia reperfusion injury of liver   . Gastrointestinal hemorrhage   . 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 ESOPHAGOGASTRODUODENOSCOPY (EGD) WITH PROPOFOL N/A 07/25/2016   Procedure:  ESOPHAGOGASTRODUODENOSCOPY (EGD) WITH PROPOFOL;  Surgeon: Mauri Pole, MD;  Location: [facility];  Service: Endoscopy;  Laterality: N/A;  . FLEXIBLE SIGMOIDOSCOPY N/A 07/25/2016   Procedure: FLEXIBLE SIGMOIDOSCOPY;  Surgeon: Mauri Pole, MD;  Location: [facility];  Service: Endoscopy;  Laterality: N/A;  . Left arm fracture  1989  . SIGMOIDOSCOPY    . UPPER GASTROINTESTINAL ENDOSCOPY    . WISDOM TOOTH EXTRACTION         Home Medications    Prior to Admission medications   Medication Sig Start Date End Date Taking? Authorizing Provider  Aspirin-Acetaminophen-Caffeine (GOODY HEADACHE PO) Take 1 packet by mouth as needed (headache).   Yes [provider]  cyanocobalamin 500 MCG tablet Take 500 mcg by mouth daily.   Yes [provider]  diphenhydrAMINE (BENADRYL) 25 MG tablet Take 25 mg by mouth every 6 (six) hours as needed for allergies.   Yes [provider]  ferrous sulfate 325 (65 FE) MG EC tablet Take 325 mg by mouth daily.   Yes [provider]  Multiple Vitamins-Minerals (CENTRUM SILVER 50+MEN PO) Take 1 tablet by mouth daily.   Yes [provider]  Omega-3 Fatty Acids (FISH OIL) 1000 MG CAPS Take 1 capsule by mouth daily.   Yes [provider]  OVER THE COUNTER MEDICATION Take 1 tablet by mouth every morning. Allergy medication (either Zyrtec or Claritin)   Yes [provider]  [place]  Apply 1 application topically as needed (Pain). Cream for muscle pain   Yes [provider]  pantoprazole (PROTONIX) 40 MG tablet Take 1 tablet (40 mg total) by mouth daily at 6 (six) AM. Patient not taking: Reported on 09/07/2016 07/28/16   Minor, Grace Bushy, NP    Family History Family History  Problem Relation Age of Onset  . Cancer Mother        Unsure type  . Hypertension Mother   . Diabetes Mother   . Congestive Heart Failure Mother   . Heart disease Father   . Kidney disease Father     . Diabetes Father   . Colon polyps Father   . Colon cancer Neg Hx   . Esophageal cancer Neg Hx   . Prostate cancer Neg Hx   . Rectal cancer Neg Hx   . Stomach cancer Neg Hx     Social History Social History  Substance Use Topics  . Smoking status: Light Tobacco Smoker    Packs/day: 0.25    Types: Cigarettes  . Smokeless tobacco: Never Used     Comment: pack cigs last about 1 week per pt  . Alcohol use No     Comment: quit 07/24/16     Allergies   Red dye   Review of Systems Review of Systems  HENT: Positive for facial swelling.   Respiratory: Positive for cough and shortness of breath.   Skin: Positive for rash.  All other systems reviewed and are negative.    Physical Exam Updated Vital Signs BP (!) 146/86   Pulse 77   Temp 98.8 F (37.1 C) (Oral)   Resp (!) 24   Ht 5\' 9"  (1.753 m)   Wt 74.8 kg (165 lb)   SpO2 100%   BMI 24.37 kg/m   Physical Exam  Constitutional: He is oriented to person, place, and time. He appears well-developed and well-nourished. No distress.  HENT:  Head: Normocephalic and atraumatic.  + facial swelling most significantly to forehead, periorbital and cheeks. No lip or oral swelling. Patent airway.  Cardiovascular: Normal rate, regular rhythm and normal heart sounds.   No murmur heard. Pulmonary/Chest: Effort normal and breath sounds normal. No respiratory distress.  Abdominal: Soft. He exhibits no distension. There is no tenderness.  Musculoskeletal: He exhibits no edema.  Neurological: He is alert and oriented to person, place, and time.  Skin: Skin is warm and dry.  Nursing note and vitals reviewed.    ED Treatments / Results  Labs (all labs ordered are listed, but only abnormal results are displayed) Labs Reviewed  COMPREHENSIVE METABOLIC PANEL - Abnormal; Notable for the following:       Result Value   Potassium 3.1 (*)    CO2 21 (*)    Glucose, Bld 156 (*)    Creatinine, Ser 1.44 (*)    Calcium 8.6 (*)    Total  Protein 6.4 (*)    Total Bilirubin 1.5 (*)    GFR calc non Af Amer 55 (*)    All other components within normal limits  CBC WITH DIFFERENTIAL/PLATELET - Abnormal; Notable for the following:    RBC 3.77 (*)    Hemoglobin 11.8 (*)    HCT 32.9 (*)    Lymphs Abs 4.7 (*)    All other components within normal limits    EKG  EKG Interpretation None       Radiology No results found.  Procedures Procedures (including critical care time)  CRITICAL CARE Performed  by: Ozella Almond Kaylob Wallen   Total critical care time: 35 minutes  Critical care time was exclusive of separately billable procedures and treating other patients.  Critical care was necessary to treat or prevent imminent or life-threatening deterioration.  Critical care was time spent personally by me on the following activities: development of treatment plan with patient and/or surrogate as well as nursing, discussions with consultants, evaluation of patient's response to treatment, examination of patient, obtaining history from patient or surrogate, ordering and performing treatments and interventions, ordering and review of laboratory studies, ordering and review of radiographic studies, pulse oximetry and re-evaluation of patient's condition.   Medications Ordered in ED Medications  0.9 %  sodium chloride infusion (not administered)  sodium chloride 0.9 % bolus 1,000 mL (0 mLs Intravenous Stopped 11/24/16 1602)  famotidine (PEPCID) IVPB 20 mg premix (0 mg Intravenous Stopped 11/24/16 1500)  EPINEPHrine (EPI-PEN) 0.3 mg/0.3 mL injection (0.3 mg  Given 11/24/16 1414)     Initial Impression / Assessment and Plan / ED Course  I have reviewed the triage vital signs and the nursing notes.  Pertinent labs & imaging results that were available during my care of the patient were reviewed by me and considered in my medical decision making (see chart for details).    Parker Nguyen is a 51 y.o. male who presents to ED by  EMS concerning for anaphylaxis. 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 Today, he developed urticarial rash, facial swelling, cough and shortness of breath about an hour after eating [employer]. He self administered epi. EMS on scene noticed cough and shortness of breath along with rash, therefore administered another epi along with benadryl, solumedrol and albuterol. Upon ER arrival, patient hemodynamically stable with no signs of airway compromise. He felt as if his symptoms were improving. While in ED, symptoms all returned. Another dose of epi was given (dose #3 total) and within 2-3 minutes, symptoms started to resolve. Given return of symptoms and history, consulted hospitalist who will admit.  Patient seen by and discussed with Dr. Jeanell Sparrow who agrees with treatment plan.   Final Clinical Impressions(s) / ED Diagnoses   Final diagnoses:  Anaphylaxis, initial encounter    New Prescriptions New Prescriptions   No medications on file     Gurjit Loconte, Ozella Almond, PA-C 11/24/16 1615    Pattricia Boss, MD 11/29/16 [phone]

## 2016-11-24 NOTE — Progress Notes (Signed)
New Admission Note:   Arrival Method: Bed Mental Orientation: A&O X4 Telemetry: Initiated Assessment: Completed Skin: WDL IV: Clean, Dry, Intact Pain: Denies Safety Measures: Safety Fall Prevention Plan has been given, discussed and signed Admission: Completed Unit Orientation: Patient has been orientated to the room, unit and staff.  Family: Daughter at bedside  Orders have been reviewed and implemented. Will continue to monitor the patient. Call light has been placed within reach and bed alarm has been activated.    Dixie Dials RN, BSN

## 2016-11-24 NOTE — ED Triage Notes (Signed)
Pt to ED via [facility] from home with allergic reaction, received EPI x 2, Albuterol 5mg  HHN, Solumedrol 125mg  IV, Benadryl 50mg  IV per ems

## 2016-11-24 NOTE — ED Notes (Signed)
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 Pt started itching on hands/feet/swelling to face and eyelids, face and chest red- no hives noted. resp unlabored at present, c/o throat feeling scratchy.  Family at bedside, daughter is a Marine scientist at [employer], helpful with education with allergies.

## 2016-11-24 NOTE — Procedures (Signed)
RT note-Called to assess patient with allergic reaction. Patient is on a NRB, sp02 100%. No stridor noted, BBS equal and clear. Continue to monitor.

## 2016-11-24 NOTE — ED Notes (Signed)
Report given to rn on 6e 

## 2016-11-24 NOTE — H&P (Signed)
History and Physical    Parker Nguyen AOZ:[id number] DOB: Feb 19, 1966 DOA: 11/24/2016  PCP: [name], NP  Patient coming from: Home  Chief Complaint: Anaphylaxis  HPI: Parker Nguyen is a 51 y.o. 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 Patient reports eating double quarter pounder at [employer] (cooked with fresh meat), fries, iced tea at lunch. Moments later, patient reported feeling "wierd" followed by itching in the L hand, then itching across the face. This was then followed by generalized swelling and sob. Patient immediately self-administered epipen and family called EMS. En route, patient received IV steroids, IV benadryl with some improvement.   ED Course: On arrival to ED, patient's swelling and symptoms worsened. Patient was started on IV famotidine and given IVF. Swelling gradually improved, although patient remains swollen. Hospitalist consulted for consideration for admission.  Review of Systems:  Review of Systems  Constitutional: Negative for chills, fever, malaise/fatigue and weight loss.  HENT: Negative for congestion, ear pain, sinus pain and tinnitus.   Eyes: Positive for blurred vision. Negative for photophobia, pain and discharge.  Respiratory: Positive for shortness of breath. Negative for cough, sputum production and wheezing.   Cardiovascular: Negative for chest pain, palpitations and orthopnea.  Gastrointestinal: Negative for abdominal pain, heartburn, nausea and vomiting.  Genitourinary: Negative for frequency, hematuria and urgency.  Musculoskeletal: Negative for back pain, falls, joint pain and neck pain.  Skin: Positive for itching and rash.  Neurological: Negative for tingling, focal weakness, seizures, loss of consciousness and headaches.  Psychiatric/Behavioral: Negative for hallucinations, memory  loss and substance abuse.    Past Medical History:  Diagnosis Date  . Alcohol use   . 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 ESOPHAGOGASTRODUODENOSCOPY (EGD) WITH PROPOFOL N/A 07/25/2016   Procedure: ESOPHAGOGASTRODUODENOSCOPY (EGD) WITH PROPOFOL;  Surgeon: Mauri Pole, MD;  Location: [facility];  Service: Endoscopy;  Laterality: N/A;  . FLEXIBLE SIGMOIDOSCOPY N/A 07/25/2016   Procedure: FLEXIBLE SIGMOIDOSCOPY;  Surgeon: Mauri Pole, MD;  Location: [facility];  Service: Endoscopy;  Laterality: N/A;  . Left arm fracture  1989  . SIGMOIDOSCOPY    . UPPER GASTROINTESTINAL ENDOSCOPY    . WISDOM TOOTH EXTRACTION       reports that he has been smoking Cigarettes.  He has been smoking about 0.25 packs per day. He has never used smokeless tobacco. He reports that he does not drink alcohol or use drugs.  Allergies  Allergen Reactions  . Red Dye Anaphylaxis    Swelling, itching,     Family History  Problem Relation Age of Onset  . Cancer Mother        Unsure type  . Hypertension Mother   . Diabetes Mother   . Congestive Heart Failure Mother   . Heart disease Father   . Kidney disease Father   . Diabetes Father   . Colon polyps Father   . Colon cancer Neg Hx   . Esophageal cancer Neg Hx   . Prostate cancer Neg Hx   . Rectal cancer Neg Hx   . Stomach cancer Neg Hx     Prior to Admission medications   Medication Sig Start Date End Date Taking? Authorizing Provider  Aspirin-Acetaminophen-Caffeine (GOODY HEADACHE PO) Take 1 packet by mouth as needed (headache).   Yes [provider]  cyanocobalamin  500 MCG tablet Take 500 mcg by mouth daily.   Yes [provider]  diphenhydrAMINE (BENADRYL) 25 MG tablet Take 25 mg by mouth every 6 (six) hours as needed for allergies.   Yes [provider]  ferrous sulfate 325 (65 FE) MG EC tablet Take 325 mg by mouth daily.   Yes [provider]   Multiple Vitamins-Minerals (CENTRUM SILVER 50+MEN PO) Take 1 tablet by mouth daily.   Yes [provider]  Omega-3 Fatty Acids (FISH OIL) 1000 MG CAPS Take 1 capsule by mouth daily.   Yes [provider]  OVER THE COUNTER MEDICATION Take 1 tablet by mouth every morning. Allergy medication (either Zyrtec or Claritin)   Yes [provider]  OVER THE COUNTER MEDICATION Apply 1 application topically as needed (Pain). Cream for muscle pain   Yes [provider]  pantoprazole (PROTONIX) 40 MG tablet Take 1 tablet (40 mg total) by mouth daily at 6 (six) AM. Patient not taking: Reported on 09/07/2016 07/28/16   MinorGrace Bushy, NP    Physical Exam: Vitals:   11/24/16 1430 11/24/16 1445 11/24/16 1500 11/24/16 1530  BP: 121/60 136/64 (!) 143/80 (!) 146/86  Pulse: 86 81 80 77  Resp: 19 (!) 21 19 (!) 24  Temp:      TempSrc:      SpO2: 100% 100% 100% 100%  Weight:      Height:        Constitutional: NAD, calm, comfortable Vitals:   11/24/16 1430 11/24/16 1445 11/24/16 1500 11/24/16 1530  BP: 121/60 136/64 (!) 143/80 (!) 146/86  Pulse: 86 81 80 77  Resp: 19 (!) 21 19 (!) 24  Temp:      TempSrc:      SpO2: 100% 100% 100% 100%  Weight:      Height:       Eyes: PERRL, lids and conjunctivae normal ENMT: Mucous membranes are moist. Posterior pharynx clear of any exudate or lesions.Normal dentition.  Neck: normal, supple, no masses, no thyromegaly Respiratory: clear to auscultation bilaterally, no wheezing, no crackles. Normal respiratory effort. No accessory muscle use.  Cardiovascular: Regular rate and rhythm, no murmurs / rubs / gallops. No extremity edema. 2+ pedal pulses. No carotid bruits.  Abdomen: no tenderness, no masses palpated. No hepatosplenomegaly. Bowel sounds positive.  Musculoskeletal: no clubbing / cyanosis. No joint deformity upper and lower extremities. Good ROM, no contractures. Normal muscle tone.  Skin: general swelling, mild erythema  over face Neurologic: CN 2-12 grossly intact. Sensation intact, DTR normal. Strength 5/5 in all 4.  Psychiatric: Normal judgment and insight. Alert and oriented x 3. Normal mood.    Labs on Admission: I have personally reviewed following labs and imaging studies  CBC:  Recent Labs Lab 11/24/16 1430  WBC 6.6  NEUTROABS 1.7  HGB 11.8*  HCT 32.9*  MCV 87.3  PLT 063   Basic Metabolic Panel:  Recent Labs Lab 11/24/16 1430  NA 140  K 3.1*  CL 106  CO2 21*  GLUCOSE 156*  BUN 10  CREATININE 1.44*  CALCIUM 8.6*   GFR: Estimated Creatinine Clearance: 61.4 mL/min (A) (by C-G formula based on SCr of 1.44 mg/dL (H)). Liver Function Tests:  Recent Labs Lab 11/24/16 1430  AST 35  ALT 19  ALKPHOS 62  BILITOT 1.5*  PROT 6.4*  ALBUMIN 3.8   No results for input(s): LIPASE, AMYLASE in the last 168 hours. No results for input(s): AMMONIA in the last 168 hours. Coagulation Profile:  No results for input(s): INR, PROTIME in the last 168 hours. Cardiac Enzymes: No results for input(s): CKTOTAL, CKMB, CKMBINDEX, TROPONINI in the last 168 hours. BNP (last 3 results) No results for input(s): PROBNP in the last 8760 hours. HbA1C: No results for input(s): HGBA1C in the last 72 hours. CBG: No results for input(s): GLUCAP in the last 168 hours. Lipid Profile: No results for input(s): CHOL, HDL, LDLCALC, TRIG, CHOLHDL, LDLDIRECT in the last 72 hours. Thyroid Function Tests: No results for input(s): TSH, T4TOTAL, FREET4, T3FREE, THYROIDAB in the last 72 hours. Anemia Panel: No results for input(s): VITAMINB12, FOLATE, FERRITIN, TIBC, IRON, RETICCTPCT in the last 72 hours. Urine analysis:    Component Value Date/Time   COLORURINE YELLOW 07/24/2016 0401   APPEARANCEUR CLEAR 07/24/2016 0401   LABSPEC 1.004 (L) 07/24/2016 0401   PHURINE 5.0 07/24/2016 0401   GLUCOSEU NEGATIVE 07/24/2016 0401   HGBUR NEGATIVE 07/24/2016 0401   BILIRUBINUR NEGATIVE 07/24/2016 0401   KETONESUR  NEGATIVE 07/24/2016 0401   PROTEINUR NEGATIVE 07/24/2016 0401   NITRITE NEGATIVE 07/24/2016 0401   LEUKOCYTESUR NEGATIVE 07/24/2016 0401   Sepsis Labs: !!!!!!!!!!!!!!!!!!!!!!!!!!!!!!!!!!!!!!!!!!!! @LABRCNTIP (procalcitonin:4,lacticidven:4) )No results found for this or any previous visit (from the past 240 hour(s)).   Radiological Exams on Admission: No results found.  EKG: Independently reviewed. NSR  Assessment/Plan Principal Problem:   Anaphylaxis Active Problems:   Hypokalemia   1. Anaphylaxis 1. 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 Patient has seen allergist 3. Patient given multiple rounds of epi pen, solumedrol, IV benadryl, h2 blocker 4. Unclear as to what triggered anaphylaxis 5. Symptoms recurred in ED. Remains edematous at this time. Able to maintain airway at this time 6. Given severity of allergic reaction, will admit to SDU for closer monitoring 2. Hypokalemia 1. Will replace 2. Repeat bmet in AM  DVT prophylaxis: heparin subQ  Code Status: Full Family Communication: Pt in room, family at bedside Disposition Plan: Uncertain at this time  Consults called:  Admission status: observation status as pt would likely require less than 2 midnight stay to resole anaphylaxis   CHIU, Orpah Melter MD [facility] Pager [phone]  If 7PM-7AM, please contact night-coverage [email or web address] Password [email or web address]  11/24/2016, 4:04 PM

## 2016-11-25 DIAGNOSIS — T782XXA Anaphylactic shock, unspecified, initial encounter: Principal | ICD-10-CM

## 2016-11-25 DIAGNOSIS — E876 Hypokalemia: Secondary | ICD-10-CM

## 2016-11-25 LAB — CBC
HEMATOCRIT: 33.5 % — AB (ref 39.0–52.0)
HEMOGLOBIN: 11.6 g/dL — AB (ref 13.0–17.0)
MCH: 30 pg (ref 26.0–34.0)
MCHC: 34.6 g/dL (ref 30.0–36.0)
MCV: 86.6 fL (ref 78.0–100.0)
Platelets: 171 10*3/uL (ref 150–400)
RBC: 3.87 MIL/uL — AB (ref 4.22–5.81)
RDW: 12.9 % (ref 11.5–15.5)
WBC: 10.4 10*3/uL (ref 4.0–10.5)

## 2016-11-25 LAB — COMPREHENSIVE METABOLIC PANEL
ALBUMIN: 3.9 g/dL (ref 3.5–5.0)
ALT: 21 U/L (ref 17–63)
AST: 24 U/L (ref 15–41)
Alkaline Phosphatase: 58 U/L (ref 38–126)
Anion gap: 6 (ref 5–15)
BUN: 11 mg/dL (ref 6–20)
CHLORIDE: 107 mmol/L (ref 101–111)
CO2: 24 mmol/L (ref 22–32)
Calcium: 9 mg/dL (ref 8.9–10.3)
Creatinine, Ser: 1.12 mg/dL (ref 0.61–1.24)
GFR calc Af Amer: >60 mL/min (ref >60)
Glucose, Bld: 140 mg/dL — ABNORMAL HIGH (ref 65–99)
POTASSIUM: 4.5 mmol/L (ref 3.5–5.1)
SODIUM: 137 mmol/L (ref 135–145)
Total Bilirubin: 1 mg/dL (ref 0.3–1.2)
Total Protein: 6.4 g/dL — ABNORMAL LOW (ref 6.5–8.1)

## 2016-11-25 MED ORDER — DIPHENHYDRAMINE HCL 25 MG PO CAPS: 25.0000 mg | ORAL_CAPSULE | Freq: Four times a day (QID) | ORAL | Status: DC | PRN

## 2016-11-25 MED ORDER — FLUTICASONE PROPIONATE 50 MCG/ACT NA SUSP: 1.0000 | Freq: Every day | NASAL | 2 refills | Status: DC | PRN

## 2016-11-25 MED ORDER — EPINEPHRINE 0.3 MG/0.3ML IJ SOAJ: 0.3000 mg | Freq: Once | INTRAMUSCULAR | 0 refills | Status: AC

## 2016-11-25 MED ORDER — DIPHENHYDRAMINE HCL 25 MG PO TABS: 25.0000 mg | ORAL_TABLET | Freq: Four times a day (QID) | ORAL | 0 refills | Status: DC | PRN

## 2016-11-25 MED ORDER — FAMOTIDINE 20 MG PO TABS
20.0000 mg | ORAL_TABLET | Freq: Two times a day (BID) | ORAL | Status: DC
  Administered 2016-11-25: 20 mg via ORAL
  Filled 2016-11-25: qty 1

## 2016-11-25 MED ORDER — ZYRTEC ALLERGY 10 MG PO TABS: 10.0000 mg | ORAL_TABLET | Freq: Every day | ORAL | 3 refills | Status: DC

## 2016-11-25 MED ORDER — FAMOTIDINE 20 MG PO TABS: 20.0000 mg | ORAL_TABLET | Freq: Two times a day (BID) | ORAL | 0 refills | Status: DC

## 2016-11-25 NOTE — Discharge Summary (Signed)
Physician Discharge Summary   Patient ID: Parker Nguyen MRN: [id number] DOB/AGE: 1966/04/07 51 y.o.  Admit date: 11/24/2016 Discharge date: 11/25/2016  Primary Care Physician:  [name], NP  Discharge Diagnoses:    . Anaphylaxis Reaction to red dye Hypokalemia  Consults:  None   Recommendations for Outpatient Follow-up:  1. Patient recommended to follow-up with Dr Mosetta Anis in 2 weeks. He is already established with Dr. Donneta Romberg (allergy and immunology) 2. He was given the prescription for EpiPen, Zyrtec, Pepcid and Benadryl   DIET: Heart healthy diet, avoid red dye allergen    Allergies:   Allergies  Allergen Reactions  . Red Dye Anaphylaxis    Swelling, itching,      DISCHARGE MEDICATIONS: Current Discharge Medication List    START taking these medications   Details  EPINEPHrine (EPIPEN 2-PAK) 0.3 mg/0.3 mL IJ SOAJ injection Inject 0.3 mLs (0.3 mg total) into the muscle once. Qty: 1 Device, Refills: 0    famotidine (PEPCID) 20 MG tablet Take 1 tablet (20 mg total) by mouth 2 (two) times daily. X 2 days Qty: 4 tablet, Refills: 0    fluticasone (FLONASE) 50 MCG/ACT nasal spray Place 1 spray into both nostrils daily as needed for allergies or rhinitis. Qty: 16 g, Refills: 2    ZYRTEC ALLERGY 10 MG tablet Take 1 tablet (10 mg total) by mouth daily. Zyrtec Qty: 30 tablet, Refills: 3      CONTINUE these medications which have CHANGED   Details  diphenhydrAMINE (BENADRYL) 25 MG tablet Take 1 tablet (25 mg total) by mouth every 6 (six) hours as needed for allergies. Over the counter Qty: 30 tablet, Refills: 0      CONTINUE these medications which have NOT CHANGED   Details  Aspirin-Acetaminophen-Caffeine (GOODY HEADACHE PO) Take 1 packet by mouth as needed (headache).    cyanocobalamin 500 MCG tablet Take 500 mcg by mouth daily.    ferrous sulfate 325 (65 FE) MG EC tablet Take 325 mg by mouth daily.    Multiple Vitamins-Minerals (CENTRUM  SILVER 50+MEN PO) Take 1 tablet by mouth daily.    Omega-3 Fatty Acids (FISH OIL) 1000 MG CAPS Take 1 capsule by mouth daily.    OVER THE COUNTER MEDICATION Apply 1 application topically as needed (Pain). Cream for muscle pain      STOP taking these medications     pantoprazole (PROTONIX) 40 MG tablet          Brief H and P: For complete details please refer to admission H and P, but in brief Parker Nguyen is a 51 y.o. 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 Patient reported eating double quarter pounder at [employer] (cooked with fresh meat), fries, iced tea at lunch. Moments later, patient reported feeling "wierd" followed by itching in the L hand, then itching across the face. This was then followed by generalized swelling and sob. Patient immediately self-administered epipen and family called EMS. En route, patient received IV steroids, IV benadryl with some improvement.  ED Course: On arrival to ED, patient's swelling and symptoms worsened. Patient was started on IV famotidine and given IVF. The patient was admitted for observation.  Hospital Course:     Anaphylaxis - Patient has a known history of red dye allergy with prior episodes of anaphylaxis in the past year, has been following Dr. Donneta Romberg (allergy and immunology) -  Patient received EpiPen, Solu-Medrol, IV Benadryl and H2 blocker. - Patient was able to maintain airway - Currently tolerating regular diet and no swelling. Patient was given prescription for EpiPen, zyrtec, Pepcid, Benadryl as needed. He was strongly recommended to follow up with Dr. Donneta Romberg in 2 weeks.     Hypokalemia Replaced  Day of Discharge BP 130/70 (BP Location: Right Arm)   Pulse 68   Temp (!) 97.5 F (36.4 C) (Oral)   Resp 18   Ht 5\' 9"  (1.753 m)   Wt 76.5 kg (168 lb 9.6 oz)   SpO2 100%   BMI 24.90  kg/m   Physical Exam: General: Alert and awake oriented x3 not in any acute distress. HEENT: anicteric sclera, pupils reactive to light and accommodation CVS: S1-S2 clear no murmur rubs or gallops Chest: clear to auscultation bilaterally, no wheezing rales or rhonchi Abdomen: soft nontender, nondistended, normal bowel sounds Extremities: no cyanosis, clubbing or edema noted bilaterally Neuro: Cranial nerves II-XII intact, no focal neurological deficits   The results of significant diagnostics from this hospitalization (including imaging, microbiology, ancillary and laboratory) are listed below for reference.    LAB RESULTS: Basic Metabolic Panel:  Recent Labs Lab 11/24/16 1430 11/25/16 0424  NA 140 137  K 3.1* 4.5  CL 106 107  CO2 21* 24  GLUCOSE 156* 140*  BUN 10 11  CREATININE 1.44* 1.12  CALCIUM 8.6* 9.0   Liver Function Tests:  Recent Labs Lab 11/24/16 1430 11/25/16 0424  AST 35 24  ALT 19 21  ALKPHOS 62 58  BILITOT 1.5* 1.0  PROT 6.4* 6.4*  ALBUMIN 3.8 3.9   No results for input(s): LIPASE, AMYLASE in the last 168 hours. No results for input(s): AMMONIA in the last 168 hours. CBC:  Recent Labs Lab 11/24/16 1430 11/25/16 0424  WBC 6.6 10.4  NEUTROABS 1.7  --   HGB 11.8* 11.6*  HCT 32.9* 33.5*  MCV 87.3 86.6  PLT 203 171   Cardiac Enzymes: No results for input(s): CKTOTAL, CKMB, CKMBINDEX, TROPONINI in the last 168 hours. BNP: Invalid input(s): POCBNP CBG: No results for input(s): GLUCAP in the last 168 hours.  Significant Diagnostic Studies:  No results found.  2D ECHO:   Disposition and Follow-up: Discharge Instructions    Diet - low sodium heart healthy    Complete by:  As directed    Discharge instructions    Complete by:  As directed    Avoid Red dye allergen and see Dr Donneta Romberg for follow-up   Increase activity slowly    Complete by:  As directed        DISPOSITION: home    DISCHARGE FOLLOW-UP Follow-up Information     Mosetta Anis, MD. Schedule an appointment as soon as possible for a visit in 2 week(s).   Specialty:  Allergy Why:  for hospital follow-up  Contact information: [address] [place] [address] [phone]        [name], NP. Schedule an appointment as soon as possible for a visit in 2 week(s).   Specialty:  Family Medicine Contact information: [address] [place] [address] [phone]            Time spent on Discharge: 8mins  Signed:   Estill Cotta M.D. [facility] 11/25/2016, 11:37 AM Pager: [phone]

## 2016-11-25 NOTE — Progress Notes (Signed)
Parker Nguyen to be D/C'd Home per MD order.  Discussed prescriptions and follow up appointments with the patient. Prescriptions given to patient, medication list explained in detail. Pt verbalized understanding.  Allergies as of 11/25/2016      Reactions   Red Dye Anaphylaxis   Swelling, itching,       Medication List    STOP taking these medications   OVER THE COUNTER MEDICATION   pantoprazole 40 MG tablet Commonly known as:  PROTONIX     TAKE these medications   CENTRUM SILVER 50+MEN PO Take 1 tablet by mouth daily.   cyanocobalamin 500 MCG tablet Take 500 mcg by mouth daily.   diphenhydrAMINE 25 MG tablet Commonly known as:  BENADRYL Take 1 tablet (25 mg total) by mouth every 6 (six) hours as needed for allergies. Over the counter What changed:  additional instructions   EPINEPHrine 0.3 mg/0.3 mL Soaj injection Commonly known as:  EPIPEN 2-PAK Inject 0.3 mLs (0.3 mg total) into the muscle once.   famotidine 20 MG tablet Commonly known as:  PEPCID Take 1 tablet (20 mg total) by mouth 2 (two) times daily. X 2 days   ferrous sulfate 325 (65 FE) MG EC tablet Take 325 mg by mouth daily.   Fish Oil 1000 MG Caps Take 1 capsule by mouth daily.   fluticasone 50 MCG/ACT nasal spray Commonly known as:  FLONASE Place 1 spray into both nostrils daily as needed for allergies or rhinitis.   GOODY HEADACHE PO Take 1 packet by mouth as needed (headache).   OVER THE COUNTER MEDICATION Apply 1 application topically as needed (Pain). Cream for muscle pain   ZYRTEC ALLERGY 10 MG tablet Generic drug:  cetirizine Take 1 tablet (10 mg total) by mouth daily. Zyrtec       Vitals:   11/25/16 0448 11/25/16 1009  BP: (!) 122/59 130/70  Pulse: (!) 53 68  Resp: 16 18  Temp: 98.1 F (36.7 C) (!) 97.5 F (36.4 C)    Skin clean, dry and intact without evidence of skin break down, no evidence of skin tears noted. IV catheter discontinued intact. Site without signs and  symptoms of complications. Dressing and pressure applied. Pt denies pain at this time. No complaints noted.  An After Visit Summary was printed and given to the patient. Patient escorted via ambulation, and D/C home via private auto.  Dixie Dials RN, BSN

## 2016-12-05 ENCOUNTER — Ambulatory Visit: Payer: BLUE CROSS/BLUE SHIELD | Admitting: Adult Health

## 2016-12-05 NOTE — Progress Notes (Deleted)
Subjective:    Patient ID: Parker Nguyen, male    DOB: 1965/05/18, 51 y.o.   MRN: [id number]  HPI   51 year old male who  has a past medical history of Alcohol use and Anaphylaxis. He presents to the office today for follow up after being seen in a local ER for anaphylaxis reaction to what the ER suspected as a red dye allergy. Per ER note:   Parker Nguyen is a 51 y.o. 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 Patient states that he ate a [employer] hamburger, french fries and sweet tea at approximately 1045. About an hour later, he noticed generalized itching and redness along with difficulty breathing. He felt as if this was similar to the beginning of allergic reaction in the past. He had his EpiPen with him which he self administered then called 911. He felt better for a few minutes, but upon EMS arrival, was coughing and having difficulty breathing once again. He felt as if he was going to have diarrhea, but did not have BM. Another epi was given by EMS along with 5 mg of albuterol, 125 mg Solu-Medrol, 50 Benadryl PO. He felt as if he is improving since medications given. His only complaint at time of initial evaluation with swelling to bilateral eyelids. He states that this was all he has eaten today and he has eaten these things in the past with no reaction. He denies any new lotions, detergents, soaps, etc. No recent outdoor activity / camping / hiking. No known bug bites.  Unsure of trigger.   Interestingly enough, Parker Nguyen had a previous anaphylaxis 4 months prior after he ate at [employer] and had Chitterlings.   He has not been tested for Alpha Gal   Today in the office he reports that   Review of Systems See HPI   Past Medical History:  Diagnosis Date  . Alcohol use   . 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 Marital status: Married    Spouse  name: N/A  . Number of children: 6  . Years of education: N/A   Occupational History  . shipping/receiving    Social History Main Topics  . Smoking status: Light Tobacco Smoker    Packs/day: 0.25    Types: Cigarettes  . Smokeless tobacco: Never Used     Comment: pack cigs last about 1 week per pt  . Alcohol use No     Comment: quit 07/24/16  . Drug use: No  . Sexual activity: Not on file   Other Topics Concern  . Not on file   Social History Narrative   Works in [employer] and Receiving        Past Surgical History:  Procedure Laterality Date  . ESOPHAGOGASTRODUODENOSCOPY (EGD) WITH PROPOFOL N/A 07/25/2016   Procedure: ESOPHAGOGASTRODUODENOSCOPY (EGD) WITH PROPOFOL;  Surgeon: Mauri Pole, MD;  Location: [facility];  Service: Endoscopy;  Laterality: N/A;  . FLEXIBLE SIGMOIDOSCOPY N/A 07/25/2016   Procedure: FLEXIBLE SIGMOIDOSCOPY;  Surgeon: Mauri Pole, MD;  Location: [facility];  Service: Endoscopy;  Laterality: N/A;  . Left arm fracture  1989  . SIGMOIDOSCOPY    . UPPER GASTROINTESTINAL ENDOSCOPY    . WISDOM TOOTH EXTRACTION      Family History  Problem Relation Age of Onset  . Cancer Mother  Unsure type  . Hypertension Mother   . Diabetes Mother   . Congestive Heart Failure Mother   . Heart disease Father   . Kidney disease Father   . Diabetes Father   . Colon polyps Father   . Colon cancer Neg Hx   . Esophageal cancer Neg Hx   . Prostate cancer Neg Hx   . Rectal cancer Neg Hx   . Stomach cancer Neg Hx     Allergies  Allergen Reactions  . Red Dye Anaphylaxis    Swelling, itching,     Current Outpatient Prescriptions on File Prior to Visit  Medication Sig Dispense Refill  . Aspirin-Acetaminophen-Caffeine (GOODY HEADACHE PO) Take 1 packet by mouth as needed (headache).    . cyanocobalamin 500 MCG tablet Take 500 mcg by mouth daily.    . diphenhydrAMINE (BENADRYL) 25 MG tablet Take 1 tablet (25 mg total) by mouth every 6 (six) hours as  needed for allergies. Over the counter 30 tablet 0  . famotidine (PEPCID) 20 MG tablet Take 1 tablet (20 mg total) by mouth 2 (two) times daily. X 2 days 4 tablet 0  . ferrous sulfate 325 (65 FE) MG EC tablet Take 325 mg by mouth daily.    . fluticasone (FLONASE) 50 MCG/ACT nasal spray Place 1 spray into both nostrils daily as needed for allergies or rhinitis. 16 g 2  . Multiple Vitamins-Minerals (CENTRUM SILVER 50+MEN PO) Take 1 tablet by mouth daily.    . Omega-3 Fatty Acids (FISH OIL) 1000 MG CAPS Take 1 capsule by mouth daily.    Marland Kitchen OVER THE COUNTER MEDICATION Apply 1 application topically as needed (Pain). Cream for muscle pain    . ZYRTEC ALLERGY 10 MG tablet Take 1 tablet (10 mg total) by mouth daily. Zyrtec 30 tablet 3   No current facility-administered medications on file prior to visit.     There were no vitals taken for this visit.      Objective:   Physical Exam        Assessment & Plan:

## 2016-12-14 DIAGNOSIS — T781XXD Other adverse food reactions, not elsewhere classified, subsequent encounter: Secondary | ICD-10-CM | POA: Diagnosis not present

## 2016-12-19 DIAGNOSIS — R21 Rash and other nonspecific skin eruption: Secondary | ICD-10-CM | POA: Diagnosis not present

## 2016-12-19 DIAGNOSIS — T781XXD Other adverse food reactions, not elsewhere classified, subsequent encounter: Secondary | ICD-10-CM | POA: Diagnosis not present

## 2016-12-19 DIAGNOSIS — H1045 Other chronic allergic conjunctivitis: Secondary | ICD-10-CM | POA: Diagnosis not present

## 2016-12-19 DIAGNOSIS — J3081 Allergic rhinitis due to animal (cat) (dog) hair and dander: Secondary | ICD-10-CM | POA: Diagnosis not present

## 2016-12-26 ENCOUNTER — Encounter: Payer: Self-pay | Admitting: Adult Health

## 2017-01-11 ENCOUNTER — Encounter: Payer: Self-pay | Admitting: Adult Health

## 2017-04-03 ENCOUNTER — Encounter: Payer: Self-pay | Admitting: Adult Health

## 2017-04-03 ENCOUNTER — Ambulatory Visit (INDEPENDENT_AMBULATORY_CARE_PROVIDER_SITE_OTHER): Payer: BLUE CROSS/BLUE SHIELD | Admitting: Adult Health

## 2017-04-03 VITALS — BP 120/90 | Temp 98.8°F | Wt 181.0 lb

## 2017-04-03 DIAGNOSIS — Z720 Tobacco use: Secondary | ICD-10-CM

## 2017-04-03 DIAGNOSIS — Z0000 Encounter for general adult medical examination without abnormal findings: Secondary | ICD-10-CM

## 2017-04-03 DIAGNOSIS — Z23 Encounter for immunization: Secondary | ICD-10-CM

## 2017-04-03 MED ORDER — VARENICLINE TARTRATE 0.5 MG X 11 & 1 MG X 42 PO MISC: ORAL | 0 refills | Status: DC

## 2017-04-03 MED ORDER — VARENICLINE TARTRATE 1 MG PO TABS: 1.0000 mg | ORAL_TABLET | Freq: Two times a day (BID) | ORAL | 3 refills | Status: DC

## 2017-04-03 NOTE — Patient Instructions (Signed)
It was great seeing you today   Please send me the labs you had done at work   I have sent in another prescription for Chantix. Follow up with me in one month after starting this medication

## 2017-04-03 NOTE — Progress Notes (Addendum)
Subjective:    Patient ID: Parker Nguyen, male    DOB: [birth year], 51 y.o.   MRN: [id number]  HPI  Patient presents for yearly preventative medicine examination. He is a pleasant 51 year old male who  has a past medical history of Alcohol use, Allergy to meat, and Anaphylaxis.   He continues to smoke about 1/4-1/2 pack per day. During his CPE vChantix was prescribed but he was not able to fill it due to cost.   All immunizations and health maintenance protocols were reviewed with the patient and needed orders were placed.  Appropriate screening laboratory values were ordered for the patient including screening of hyperlipidemia, renal function and hepatic function.   Medication reconciliation,  past medical history, social history, problem list and allergies were reviewed in detail with the patient  Goals were established with regard to weight loss, exercise, and  diet in compliance with medications. He is a very active and exercises on a regular basis and eats healthy   End of life planning was discussed.  He is up to date on his colonoscopy. He sees his dentist routinely. Does not participate in routine vision exams   Interval history includes that of two hospital visits for Anaphylactic reaction. He was then seen by Allergy where he was tested and came back positive for Alpha-gal    Today in the office he denies any complaints   Review of Systems  Constitutional: Negative.   HENT: Negative.   Eyes: Negative.   Respiratory: Negative.   Cardiovascular: Negative.   Gastrointestinal: Negative.   Endocrine: Negative.   Genitourinary: Negative.   Musculoskeletal: Negative.   Skin: Negative.   Allergic/Immunologic: Negative.   Neurological: Negative.   Hematological: Negative.   Psychiatric/Behavioral: Negative.   All other systems reviewed and are negative.    Past Medical History:  Diagnosis Date  . Alcohol use   . Allergy to meat   . Anaphylaxis    shock  - feb 3 ,2018    Social History   Socioeconomic History  . Marital status: Married    Spouse name: Not on file  . Number of children: 6  . Years of education: Not on file  . Highest education level: Not on file  Social Needs  . Financial resource strain: Not on file  . Food insecurity - worry: Not on file  . Food insecurity - inability: Not on file  . Transportation needs - medical: Not on file  . Transportation needs - non-medical: Not on file  Occupational History  . Occupation: shipping/receiving  Tobacco Use  . Smoking status: Light Tobacco Smoker    Packs/day: 0.25    Types: Cigarettes  . Smokeless tobacco: Never Used  . Tobacco comment: pack cigs last about 1 week per pt  Substance and Sexual Activity  . Alcohol use: No    Alcohol/week: 1.8 - 3.0 oz    Types: 3 - 5 Cans of beer per week    Comment: quit 07/24/16  . Drug use: No  . Sexual activity: Not on file  Other Topics Concern  . 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 ESOPHAGOGASTRODUODENOSCOPY (EGD) WITH PROPOFOL N/A 07/25/2016   Procedure: ESOPHAGOGASTRODUODENOSCOPY (EGD) WITH PROPOFOL;  Surgeon: Mauri Pole, MD;  Location: [facility];  Service: Endoscopy;  Laterality: N/A;  . FLEXIBLE SIGMOIDOSCOPY N/A 07/25/2016   Procedure: FLEXIBLE SIGMOIDOSCOPY;  Surgeon: Mauri Pole, MD;  Location: [facility];  Service: Endoscopy;  Laterality: N/A;  . Left arm fracture  1989  . SIGMOIDOSCOPY    . UPPER GASTROINTESTINAL ENDOSCOPY    . WISDOM TOOTH EXTRACTION      Family History  Problem Relation Age of Onset  . Cancer Mother        Unsure type  . Hypertension Mother   . Diabetes Mother   . Congestive Heart Failure Mother   . Heart disease Father   . Kidney disease Father   . Diabetes Father   . Colon polyps Father   . Colon cancer Neg Hx   . Esophageal cancer Neg Hx   . Prostate cancer Neg Hx   . Rectal  cancer Neg Hx   . Stomach cancer Neg Hx     Allergies  Allergen Reactions  . Red Dye Anaphylaxis    Swelling, itching,     Current Outpatient Medications on File Prior to Visit  Medication Sig Dispense Refill  . cyanocobalamin 500 MCG tablet Take 500 mcg by mouth daily.    . diphenhydrAMINE (BENADRYL) 25 MG tablet Take 1 tablet (25 mg total) by mouth every 6 (six) hours as needed for allergies. Over the counter 30 tablet 0  . Multiple Vitamins-Minerals (CENTRUM SILVER 50+MEN PO) Take 1 tablet by mouth daily.    Marland Kitchen ZYRTEC ALLERGY 10 MG tablet Take 1 tablet (10 mg total) by mouth daily. Zyrtec 30 tablet 3  . Omega-3 Fatty Acids (FISH OIL) 1000 MG CAPS Take 1 capsule by mouth daily.     No current facility-administered medications on file prior to visit.     BP 120/90 (BP Location: Left Arm)   Temp 98.8 F (37.1 C) (Oral)   Wt 181 lb (82.1 kg)   BMI 26.73 kg/m       Objective:   Physical Exam  Constitutional: He is oriented to person, place, and time. He appears well-developed and well-nourished. No distress.  HENT:  Head: Normocephalic and atraumatic.  Right Ear: External ear normal.  Left Ear: External ear normal.  Nose: Nose normal.  Mouth/Throat: Oropharynx is clear and moist. No oropharyngeal exudate.  Eyes: Conjunctivae and EOM are normal. Pupils are equal, round, and reactive to light. Right eye exhibits no discharge. Left eye exhibits no discharge. No scleral icterus.  Neck: Trachea normal and normal range of motion. Neck supple. No JVD present. Carotid bruit is not present. No tracheal deviation present. No thyroid mass and no thyromegaly present.  Cardiovascular: Normal rate, regular rhythm, normal heart sounds and intact distal pulses. Exam reveals no gallop and no friction rub.  No murmur heard. Pulmonary/Chest: Effort normal and breath sounds normal. No stridor. No respiratory distress. He has no wheezes. He has no rales. He exhibits no tenderness.  Abdominal:  Soft. Bowel sounds are normal. He exhibits no distension and no mass. There is no tenderness. There is no rebound and no guarding.  Genitourinary: Rectum normal and prostate normal.  Musculoskeletal: Normal range of motion. He exhibits no edema, tenderness or deformity.  Lymphadenopathy:    He has no cervical adenopathy.  Neurological: He is alert and oriented to person, place, and time. He has normal reflexes. He displays normal reflexes. No cranial nerve deficit. He exhibits normal muscle tone. Coordination normal.  Skin: Skin is warm and dry. No rash noted. He is not diaphoretic. No erythema. No pallor.  Psychiatric: He has a normal mood and affect. His behavior is normal. Judgment  and thought content normal.  Nursing note and vitals reviewed.     Assessment & Plan:  1. Routine general medical examination at a health care facility - He had had labs done at work recently. He forgot to bring the paperwork to the office today.  He will make a copy and send to me. He was advised that blood work may need to be added to make sure everything is complete. He is ok with this  - Continue to exercise and eat healthy  - Follow up in one year or sooner if needed  2. Need for prophylactic vaccination and inoculation against influenza  - Flu Vaccine QUAD 6+ mos PF IM (Fluarix Quad PF)  3. Tobacco use - We discussed options to quit smoking. He would like to trial chantix again. His insurance should cover it now  Trial of chantix. Common side effects including rare risk of suicide ideation was discussed with the patient today.  Patient is instructed to go directly to the ED if this occurs.  We discussed that patient can continue to smoke for 1 week after starting chantix, but then must discontinue cigarettes.  He is also instructed to contact us prior to completion of the starter month pack for an rx for the continuation month pack.  5 minutes spent with patient today on tobacco cessation counseling.   -  varenicline (CHANTIX CONTINUING MONTH PAK) 1 MG tablet; Take 1 tablet (1 mg total) by mouth 2 (two) times daily.  Dispense: 60 tablet; Refill: 3 - varenicline (CHANTIX STARTING MONTH PAK) 0.5 MG X 11 & 1 MG X 42 tablet; Take one 0.5 mg tablet by mouth once daily for 3 days, then increase to one 0.5 mg tablet twice daily for 4 days, then increase to one 1 mg tablet twice daily.  Dispense: 53 tablet; Refill: 0   [name], NP

## 2017-04-08 ENCOUNTER — Telehealth: Payer: Self-pay | Admitting: *Deleted

## 2017-04-08 NOTE — Telephone Encounter (Signed)
Prior auth for Chantix continuing month pak 1mg  sent to [email or web address].

## 2017-04-24 ENCOUNTER — Other Ambulatory Visit: Payer: Self-pay | Admitting: Adult Health

## 2017-04-25 NOTE — Telephone Encounter (Signed)
Ok to refill for one year  

## 2017-04-25 NOTE — Telephone Encounter (Signed)
Cory, I do not see that you have prescribed this in the past.  Please advise. 

## 2017-04-25 NOTE — Telephone Encounter (Signed)
Sent to the pharmacy by e-scribe. 

## 2017-11-29 DIAGNOSIS — R21 Rash and other nonspecific skin eruption: Secondary | ICD-10-CM | POA: Diagnosis not present

## 2017-11-29 DIAGNOSIS — T781XXD Other adverse food reactions, not elsewhere classified, subsequent encounter: Secondary | ICD-10-CM | POA: Diagnosis not present

## 2017-11-29 DIAGNOSIS — J3081 Allergic rhinitis due to animal (cat) (dog) hair and dander: Secondary | ICD-10-CM | POA: Diagnosis not present

## 2017-11-29 DIAGNOSIS — F17200 Nicotine dependence, unspecified, uncomplicated: Secondary | ICD-10-CM | POA: Diagnosis not present

## 2018-01-08 DIAGNOSIS — M71571 Other bursitis, not elsewhere classified, right ankle and foot: Secondary | ICD-10-CM | POA: Diagnosis not present

## 2018-01-08 DIAGNOSIS — M71572 Other bursitis, not elsewhere classified, left ankle and foot: Secondary | ICD-10-CM | POA: Diagnosis not present

## 2018-01-08 DIAGNOSIS — M21622 Bunionette of left foot: Secondary | ICD-10-CM | POA: Diagnosis not present

## 2018-03-06 LAB — HEPATIC FUNCTION PANEL
ALT: 19 (ref 10–40)
AST: 27 (ref 14–40)
Bilirubin, Total: 2.5

## 2018-03-06 LAB — BASIC METABOLIC PANEL
BUN: 14 (ref 4–21)
Creatinine: 1.1 (ref 0.6–1.3)
Glucose: 85
Sodium: 139 (ref 137–147)

## 2018-03-06 LAB — HEMOGLOBIN A1C: Hemoglobin A1C: 5.2

## 2018-03-06 LAB — LIPID PANEL
Cholesterol: 183 (ref 0–200)
HDL: 61 (ref 35–70)
LDL CALC: 103
Triglycerides: 95 (ref 40–160)

## 2018-03-06 LAB — IRON,TIBC AND FERRITIN PANEL: IRON: 205

## 2018-04-02 ENCOUNTER — Telehealth: Payer: Self-pay | Admitting: *Deleted

## 2018-04-02 NOTE — Telephone Encounter (Signed)
Parker Nguyen, please advise if pt can be worked in and where.

## 2018-04-02 NOTE — Telephone Encounter (Signed)
You can put him in an afternoon slot.

## 2018-04-02 NOTE — Telephone Encounter (Signed)
Please advise Misty where the patient can be worked in. If unable to have CPEs back to back, when can the patient be seen before the end of the year. Pt needs CPE prior to 2020.  Thanks.

## 2018-04-02 NOTE — Telephone Encounter (Signed)
Copied from [place] [id number]. Topic: General - Other >> Mar 31, 2018  9:35 AM Janace Aris A wrote: Reason for CRM: pt called in wanting to schedule his AWV, advised pt the first avail date would be in January. Pt says he can not wait till then, he says this would need to be done before the end of this month.   Please advise >> Apr 02, 2018 11:12 AM Cox, Melburn Hake, CMA wrote: Pt is not on Medicare so not sure why he would need AWV...  LMTCB to clarify - per chart pt needs CPE >> Apr 02, 2018 11:21 AM Bea Graff, NT wrote: Pt called back to schedule physical. Cory's first available is 04/24/2018. He states he has to have it done this year so that his premium does not go up. He would like to see if he can be worked in.

## 2018-04-03 NOTE — Telephone Encounter (Signed)
LM for patient to call back.  Please schedule on 04/08/18 in a PM slot for CPE (41min)

## 2018-04-03 NOTE — Telephone Encounter (Signed)
Pt scheduled 04/08/18 at 3:30 Nothing further needed.

## 2018-04-08 ENCOUNTER — Ambulatory Visit: Payer: BLUE CROSS/BLUE SHIELD | Admitting: Adult Health

## 2018-04-08 ENCOUNTER — Encounter: Payer: Self-pay | Admitting: Adult Health

## 2018-04-08 VITALS — BP 120/90 | Temp 98.4°F | Ht 67.25 in | Wt 169.0 lb

## 2018-04-08 DIAGNOSIS — Z23 Encounter for immunization: Secondary | ICD-10-CM

## 2018-04-08 DIAGNOSIS — Z720 Tobacco use: Secondary | ICD-10-CM

## 2018-04-08 DIAGNOSIS — Z0000 Encounter for general adult medical examination without abnormal findings: Secondary | ICD-10-CM

## 2018-04-08 DIAGNOSIS — Z125 Encounter for screening for malignant neoplasm of prostate: Secondary | ICD-10-CM | POA: Diagnosis not present

## 2018-04-08 MED ORDER — VARENICLINE TARTRATE 1 MG PO TABS: 1.0000 mg | ORAL_TABLET | Freq: Two times a day (BID) | ORAL | 3 refills | Status: AC

## 2018-04-08 MED ORDER — VARENICLINE TARTRATE 0.5 MG X 11 & 1 MG X 42 PO MISC: ORAL | 0 refills | Status: DC

## 2018-04-08 NOTE — Progress Notes (Signed)
Subjective:    Patient ID: Parker Nguyen, male    DOB: 1965-05-12, 52 y.o.   MRN: [id number]  HPI  Patient presents for yearly preventative medicine examination. He is a pleasant 52 year old male who  has a past medical history of Alcohol use, Allergy to meat, and Anaphylaxis.  Tobacco Use - He continues to smoke about 1/2 pack per day. We have prescribed Chantix in the past but it was not covered by his insurance. He would like to try this again   All immunizations and health maintenance protocols were reviewed with the patient and needed orders were placed. utd  Appropriate screening laboratory values were ordered for the patient including screening of hyperlipidemia, renal function and hepatic function. If indicated by BPH, a PSA was ordered.  Medication reconciliation,  past medical history, social history, problem list and allergies were reviewed in detail with the patient  Goals were established with regard to weight loss, exercise, and  diet in compliance with medications. He is working out every day ( Doctor, hospital).   Wt Readings from Last 3 Encounters:  04/08/18 169 lb (76.7 kg)  04/03/17 181 lb (82.1 kg)  11/24/16 168 lb 9.6 oz (76.5 kg)   He is up to date on routine screening colonoscopies   Review of Systems  Constitutional: Negative.   HENT: Negative.   Eyes: Negative.   Respiratory: Negative.   Cardiovascular: Negative.   Gastrointestinal: Negative.   Endocrine: Negative.   Genitourinary: Negative.   Musculoskeletal: Negative.   Skin: Negative.   Allergic/Immunologic: Negative.   Neurological: Negative.   Hematological: Negative.   Psychiatric/Behavioral: Negative.   All other systems reviewed and are negative.  Past Medical History:  Diagnosis Date  . Alcohol use   . Allergy to meat   . 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 Marital status: Married    Spouse name: Not on file  . Number of  children: 6  . Years of education: Not on file  . Highest education level: Not on file  Occupational History  . Occupation: shipping/receiving  Social Needs  . Financial resource strain: Not on file  . Food insecurity:    Worry: Not on file    Inability: Not on file  . Transportation needs:    Medical: Not on file    Non-medical: Not on file  Tobacco Use  . Smoking status: Light Tobacco Smoker    Packs/day: 0.25    Types: Cigarettes  . Smokeless tobacco: Never Used  . Tobacco comment: pack cigs last about 1 week per pt  Substance and Sexual Activity  . Alcohol use: No    Alcohol/week: 3.0 - 5.0 standard drinks    Types: 3 - 5 Cans of beer per week    Comment: quit 07/24/16  . Drug use: No  . Sexual activity: Not on file  Lifestyle  . Physical activity:    Days per week: Not on file    Minutes per session: Not on file  . Stress: Not on file  Relationships  . Social connections:    Talks on phone: Not on file    Gets together: Not on file    Attends religious service: Not on file    Active member of club or organization: Not on file    Attends meetings of clubs or organizations: Not on file    Relationship status: Not on file  .  Intimate partner violence:    Fear of current or ex partner: Not on file    Emotionally abused: Not on file    Physically abused: Not on file    Forced sexual activity: Not on file  Other Topics Concern  . 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 ESOPHAGOGASTRODUODENOSCOPY (EGD) WITH PROPOFOL N/A 07/25/2016   Procedure: ESOPHAGOGASTRODUODENOSCOPY (EGD) WITH PROPOFOL;  Surgeon: Mauri Pole, MD;  Location: [facility];  Service: Endoscopy;  Laterality: N/A;  . FLEXIBLE SIGMOIDOSCOPY N/A 07/25/2016   Procedure: FLEXIBLE SIGMOIDOSCOPY;  Surgeon: Mauri Pole, MD;  Location: [facility];  Service: Endoscopy;  Laterality: N/A;  . Left arm fracture   1989  . SIGMOIDOSCOPY    . UPPER GASTROINTESTINAL ENDOSCOPY    . WISDOM TOOTH EXTRACTION      Family History  Problem Relation Age of Onset  . Cancer Mother        Unsure type  . Hypertension Mother   . Diabetes Mother   . Congestive Heart Failure Mother   . Heart disease Father   . Kidney disease Father   . Diabetes Father   . Colon polyps Father   . Colon cancer Neg Hx   . Esophageal cancer Neg Hx   . Prostate cancer Neg Hx   . Rectal cancer Neg Hx   . Stomach cancer Neg Hx     Allergies  Allergen Reactions  . 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 cetirizine (ZYRTEC) 10 MG tablet TAKE 1 TABLET BY MOUTH ONCE DAILY 90 tablet 3  . diphenhydrAMINE (BENADRYL) 25 MG tablet Take 1 tablet (25 mg total) by mouth every 6 (six) hours as needed for allergies. Over the counter 30 tablet 0  . Multiple Vitamins-Minerals (CENTRUM SILVER 50+MEN PO) Take 1 tablet by mouth daily.    . Omega-3 Fatty Acids (FISH OIL) 1000 MG CAPS Take 1 capsule by mouth daily.     No current facility-administered medications on file prior to visit.     BP 120/90   Temp 98.4 F (36.9 C)   Ht 5' 7.25" (1.708 m) Comment: WITHOUT SHOES  Wt 169 lb (76.7 kg)   BMI 26.27 kg/m       Objective:   Physical Exam Vitals signs and nursing note reviewed.  Constitutional:      General: He is not in acute distress.    Appearance: Normal appearance. He is well-developed and normal weight. He is not diaphoretic.  HENT:     Head: Normocephalic and atraumatic.     Right Ear: Tympanic membrane, ear canal and external ear normal. There is no impacted cerumen.     Left Ear: Tympanic membrane, ear canal and external ear normal. There is no impacted cerumen.     Nose: Nose normal. No congestion or rhinorrhea.     Mouth/Throat:     Mouth: Mucous membranes are moist.     Pharynx: Oropharynx is clear. No oropharyngeal exudate or  posterior oropharyngeal erythema.  Eyes:     General:        Right eye: No discharge.        Left eye: No discharge.     Extraocular Movements: Extraocular movements intact.     Conjunctiva/sclera: Conjunctivae normal.     Pupils: Pupils are equal, round, and reactive  to light.  Neck:     Musculoskeletal: Normal range of motion and neck supple.     Thyroid: No thyromegaly.     Trachea: No tracheal deviation.  Cardiovascular:     Rate and Rhythm: Normal rate and regular rhythm.     Heart sounds: Normal heart sounds. No murmur. No friction rub. No gallop.   Pulmonary:     Effort: Pulmonary effort is normal. No respiratory distress.     Breath sounds: Normal breath sounds. No stridor. No wheezing, rhonchi or rales.  Chest:     Chest wall: No tenderness.  Abdominal:     General: Bowel sounds are normal. There is no distension.     Palpations: Abdomen is soft. There is no mass.     Tenderness: There is no abdominal tenderness. There is no guarding or rebound.     Hernia: No hernia is present.  Genitourinary:    Prostate: Normal.     Rectum: Normal. Guaiac result negative.  Musculoskeletal: Normal range of motion.  Lymphadenopathy:     Cervical: No cervical adenopathy.  Skin:    General: Skin is warm and dry.     Capillary Refill: Capillary refill takes less than 2 seconds.     Coloration: Skin is not pale.     Findings: No erythema or rash.  Neurological:     General: No focal deficit present.     Mental Status: He is alert and oriented to person, place, and time.     Cranial Nerves: No cranial nerve deficit.     Coordination: Coordination normal.  Psychiatric:        Mood and Affect: Mood normal.        Behavior: Behavior normal.        Thought Content: Thought content normal.        Judgment: Judgment normal.       Assessment & Plan:  1. Routine general medical examination at a health care facility - He had lab work done at his job, including A1c, BMP, and lipid  profile. We reviewed these in detail - these labs were normal and will be scanned into chart  - Follow up in one year or sooner if needed - CBC with Differential/Platelet  2. Prostate cancer screening  - PSA  3. Need for prophylactic vaccination and inoculation against influenza  - Flu Vaccine QUAD 6+ mos PF IM (Fluarix Quad PF)  4. Tobacco use - Encouraged to quit completely  - varenicline (CHANTIX CONTINUING MONTH PAK) 1 MG tablet; Take 1 tablet (1 mg total) by mouth 2 (two) times daily.  Dispense: 60 tablet; Refill: 3 - varenicline (CHANTIX STARTING MONTH PAK) 0.5 MG X 11 & 1 MG X 42 tablet; Take one 0.5 mg tablet by mouth once daily for 3 days, then increase to one 0.5 mg tablet twice daily for 4 days, then increase to one 1 mg tablet twice daily.  Dispense: 53 tablet; Refill: 0  [name], NP

## 2018-04-09 ENCOUNTER — Encounter: Payer: Self-pay | Admitting: Family Medicine

## 2018-04-09 LAB — CBC WITH DIFFERENTIAL/PLATELET
BASOS PCT: 1.7 % (ref 0.0–3.0)
Basophils Absolute: 0.1 10*3/uL (ref 0.0–0.1)
EOS ABS: 0 10*3/uL (ref 0.0–0.7)
Eosinophils Relative: 0.8 % (ref 0.0–5.0)
HCT: 40.6 % (ref 39.0–52.0)
Hemoglobin: 14.2 g/dL (ref 13.0–17.0)
Lymphocytes Relative: 41 % (ref 12.0–46.0)
Lymphs Abs: 2.6 10*3/uL (ref 0.7–4.0)
MCHC: 34.8 g/dL (ref 30.0–36.0)
MCV: 94.9 fl (ref 78.0–100.0)
MONO ABS: 0.5 10*3/uL (ref 0.1–1.0)
Monocytes Relative: 7.6 % (ref 3.0–12.0)
NEUTROS ABS: 3 10*3/uL (ref 1.4–7.7)
NEUTROS PCT: 48.9 % (ref 43.0–77.0)
PLATELETS: 189 10*3/uL (ref 150.0–400.0)
RBC: 4.28 Mil/uL (ref 4.22–5.81)
RDW: 13 % (ref 11.5–15.5)
WBC: 6.2 10*3/uL (ref 4.0–10.5)

## 2018-04-09 LAB — PSA: PSA: 0.5 ng/mL (ref 0.10–4.00)

## 2018-04-29 ENCOUNTER — Other Ambulatory Visit: Payer: Self-pay | Admitting: Adult Health

## 2018-04-30 NOTE — Telephone Encounter (Signed)
Sent to the pharmacy by e-scribe. 

## 2018-06-30 ENCOUNTER — Other Ambulatory Visit: Payer: Self-pay | Admitting: Adult Health

## 2018-06-30 DIAGNOSIS — Z720 Tobacco use: Secondary | ICD-10-CM

## 2018-06-30 MED ORDER — VARENICLINE TARTRATE 0.5 MG X 11 & 1 MG X 42 PO MISC: ORAL | 0 refills | Status: DC

## 2018-06-30 NOTE — Telephone Encounter (Signed)
Copied from [place] [id number]. Topic: Quick Communication - Rx Refill/Question >> Jun 30, 2018 10:05 AM Reyne Dumas L wrote: Medication: varenicline (CHANTIX STARTING MONTH PAK) 0.5 MG X 11 & 1 MG X 42 tablet   Has the patient contacted their pharmacy? No - pt states he lost printed script he was given and just wants this to be called to pharmacy (Agent: If no, request that the patient contact the pharmacy for the refill.) (Agent: If yes, when and what did the pharmacy advise?)  Preferred Pharmacy (with phone number or street name): [id number] - [place], [place] AT [address] [phone] (Phone) [phone] (Fax)  Agent: Please be advised that RX refills may take up to 3 business days. We ask that you follow-up with your pharmacy.

## 2018-07-03 ENCOUNTER — Telehealth: Payer: Self-pay

## 2018-07-03 NOTE — Telephone Encounter (Signed)
PA for Chantix has been sent to cover my meds.  (Key: [place]) - [id number]

## 2018-07-04 ENCOUNTER — Telehealth: Payer: Self-pay

## 2018-07-04 NOTE — Telephone Encounter (Signed)
Copied from [place] [id number]. Topic: General - Other >> Jul 01, 2018 11:22 AM Carolyn Stare wrote:  Pt said the below need med a PA    varenicline (CHANTIX STARTING MONTH PAK) 0.5 MG X 11 & 1 MG X 42 tablet

## 2018-07-10 ENCOUNTER — Telehealth: Payer: Self-pay

## 2018-07-10 NOTE — Telephone Encounter (Signed)
PA for varenicline (CHANTIX STARTING MONTH PAK) 0.5 MG X 11 & 1 MG X 42 tablet has been sent to cover my meds.

## 2018-12-05 DIAGNOSIS — R21 Rash and other nonspecific skin eruption: Secondary | ICD-10-CM | POA: Diagnosis not present

## 2018-12-05 DIAGNOSIS — T781XXD Other adverse food reactions, not elsewhere classified, subsequent encounter: Secondary | ICD-10-CM | POA: Diagnosis not present

## 2018-12-05 DIAGNOSIS — F17200 Nicotine dependence, unspecified, uncomplicated: Secondary | ICD-10-CM | POA: Diagnosis not present

## 2018-12-05 DIAGNOSIS — J3081 Allergic rhinitis due to animal (cat) (dog) hair and dander: Secondary | ICD-10-CM | POA: Diagnosis not present

## 2019-04-13 DIAGNOSIS — Z9189 Other specified personal risk factors, not elsewhere classified: Secondary | ICD-10-CM | POA: Diagnosis not present

## 2019-04-13 DIAGNOSIS — Z03818 Encounter for observation for suspected exposure to other biological agents ruled out: Secondary | ICD-10-CM | POA: Diagnosis not present

## 2019-04-13 DIAGNOSIS — Z20828 Contact with and (suspected) exposure to other viral communicable diseases: Secondary | ICD-10-CM | POA: Diagnosis not present

## 2019-04-14 ENCOUNTER — Ambulatory Visit (INDEPENDENT_AMBULATORY_CARE_PROVIDER_SITE_OTHER): Payer: BC Managed Care – PPO | Admitting: Adult Health

## 2019-04-14 ENCOUNTER — Encounter: Payer: Self-pay | Admitting: Adult Health

## 2019-04-14 VITALS — BP 138/86 | Temp 97.5°F | Ht 69.0 in | Wt 179.0 lb

## 2019-04-14 DIAGNOSIS — Z720 Tobacco use: Secondary | ICD-10-CM

## 2019-04-14 DIAGNOSIS — Z23 Encounter for immunization: Secondary | ICD-10-CM | POA: Diagnosis not present

## 2019-04-14 DIAGNOSIS — Z125 Encounter for screening for malignant neoplasm of prostate: Secondary | ICD-10-CM

## 2019-04-14 DIAGNOSIS — Z0000 Encounter for general adult medical examination without abnormal findings: Secondary | ICD-10-CM

## 2019-04-14 NOTE — Progress Notes (Signed)
Subjective:    Patient ID: Parker Nguyen, male    DOB: Oct 07, 1965, 53 y.o.   MRN: [id number]  HPI Patient presents for yearly preventative medicine examination. He is a pleasant 53 year old male who  has a past medical history of Alcohol use, Allergy to meat, and Anaphylaxis.    Tobacco Use - He continues to smoke about 5-6 cigarettes a day, while at work. Insurance would not cover Chantix    All immunizations and health maintenance protocols were reviewed with the patient and needed orders were placed.  Appropriate screening laboratory values were ordered for the patient including screening of hyperlipidemia, renal function and hepatic function. If indicated by BPH, a PSA was ordered.  Medication reconciliation,  past medical history, social history, problem list and allergies were reviewed in detail with the patient  Goals were established with regard to weight loss, exercise, and  diet in compliance with medications. 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 He is eating a heart healthy diet   Wt Readings from Last 3 Encounters:  04/14/19 179 lb (81.2 kg)  04/08/18 169 lb (76.7 kg)  04/03/17 181 lb (82.1 kg)   He is up to date on routine screening colonoscopy.   Review of Systems  Constitutional: Negative.   HENT: Negative.   Eyes: Negative.   Respiratory: Negative.   Cardiovascular: Negative.   Gastrointestinal: Negative.   Endocrine: Negative.   Genitourinary: Negative.   Musculoskeletal: Negative.   Skin: Negative.   Allergic/Immunologic: Negative.   Neurological: Negative.   Hematological: Negative.   Psychiatric/Behavioral: Negative.   All other systems reviewed and are negative.    Past Medical History:  Diagnosis Date  . Alcohol use   . Allergy to meat   . 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 Marital status: Married    Spouse name: Not on file  . Number of  children: 6  . Years of education: Not on file  . Highest education level: Not on file  Occupational History  . Occupation: shipping/receiving  Tobacco Use  . Smoking status: Light Tobacco Smoker    Packs/day: 0.25    Types: Cigarettes  . Smokeless tobacco: Never Used  . Tobacco comment: pack cigs last about 1 week per pt  Substance and Sexual Activity  . Alcohol use: No    Alcohol/week: 3.0 - 5.0 standard drinks    Types: 3 - 5 Cans of beer per week    Comment: quit 07/24/16  . Drug use: No  . Sexual activity: Not on file  Other Topics Concern  . Not on file  Social History Narrative   Works in [employer] and Receiving    Social Determinants of Health   Financial Resource Strain:   . Difficulty of Paying Living Expenses: Not on file  Food Insecurity:   . Worried About Charity fundraiser in the Last Year: Not on file  . Ran Out of Food in the Last Year: Not on file  Transportation Needs:   . Lack of Transportation (Medical): Not on file  . Lack of Transportation (Non-Medical): Not on file  Physical Activity:   . Days of Exercise per Week: Not on file  . Minutes of Exercise per Session: Not on file  Stress:   . Feeling of Stress : Not on file  Social Connections:   . Frequency of Communication with Friends  and Family: Not on file  . Frequency of Social Gatherings with Friends and Family: Not on file  . Attends Religious Services: Not on file  . Active Member of Clubs or Organizations: Not on file  . Attends Archivist Meetings: Not on file  . Marital Status: Not on file  Intimate Partner Violence:   . Fear of Current or Ex-Partner: Not on file  . Emotionally Abused: Not on file  . Physically Abused: Not on file  . Sexually Abused: Not on file    Past Surgical History:  Procedure Laterality Date  . ESOPHAGOGASTRODUODENOSCOPY (EGD) WITH PROPOFOL N/A 07/25/2016   Procedure: ESOPHAGOGASTRODUODENOSCOPY (EGD) WITH PROPOFOL;  Surgeon: Mauri Pole, MD;   Location: [facility];  Service: Endoscopy;  Laterality: N/A;  . FLEXIBLE SIGMOIDOSCOPY N/A 07/25/2016   Procedure: FLEXIBLE SIGMOIDOSCOPY;  Surgeon: Mauri Pole, MD;  Location: [facility];  Service: Endoscopy;  Laterality: N/A;  . Left arm fracture  1989  . SIGMOIDOSCOPY    . UPPER GASTROINTESTINAL ENDOSCOPY    . WISDOM TOOTH EXTRACTION      Family History  Problem Relation Age of Onset  . Cancer Mother        Unsure type  . Hypertension Mother   . Diabetes Mother   . Congestive Heart Failure Mother   . Heart disease Father   . Kidney disease Father   . Diabetes Father   . Colon polyps Father   . Colon cancer Neg Hx   . Esophageal cancer Neg Hx   . Prostate cancer Neg Hx   . Rectal cancer Neg Hx   . Stomach cancer Neg Hx     Allergies  Allergen Reactions  . 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 cetirizine (ZYRTEC) 10 MG tablet TAKE 1 TABLET BY MOUTH EVERY DAY 90 tablet 3  . Flaxseed, Linseed, (FLAX SEEDS PO) Take by mouth.    . Multiple Vitamins-Minerals (CENTRUM SILVER 50+MEN PO) Take 1 tablet by mouth daily.    . Omega-3 Fatty Acids (FISH OIL) 1000 MG CAPS Take 1 capsule by mouth daily.    Marland Kitchen OVER THE COUNTER MEDICATION Test X - testosterone booster    . TURMERIC PO Take by mouth.     No current facility-administered medications on file prior to visit.    BP 138/86   Temp (!) 97.5 F (36.4 C) (Temporal)   Ht 5\' 9"  (1.753 m)   Wt 179 lb (81.2 kg)   BMI 26.43 kg/m       Objective:   Physical Exam Vitals and nursing note reviewed.  Constitutional:      Appearance: Normal appearance.  HENT:     Head: Normocephalic and atraumatic.     Right Ear: Tympanic membrane, ear canal and external ear normal. There is no impacted cerumen.     Left Ear: Tympanic membrane, ear canal and external ear normal. There is no impacted cerumen.     Nose: Nose normal. No congestion or  rhinorrhea.     Mouth/Throat:     Mouth: Mucous membranes are moist.     Pharynx: Oropharynx is clear.  Eyes:     Extraocular Movements: Extraocular movements intact.     Conjunctiva/sclera: Conjunctivae normal.     Pupils: Pupils are equal, round, and reactive to light.  Cardiovascular:     Rate and Rhythm: Normal rate and regular rhythm.     Pulses: Normal  pulses.     Heart sounds: Normal heart sounds. No murmur. No friction rub. No gallop.   Pulmonary:     Effort: Pulmonary effort is normal. No respiratory distress.     Breath sounds: Normal breath sounds. No stridor. No wheezing, rhonchi or rales.  Chest:     Chest wall: No tenderness.  Abdominal:     General: Abdomen is flat. Bowel sounds are normal. There is no distension.     Palpations: Abdomen is soft. There is no mass.     Tenderness: There is no abdominal tenderness. There is no right CVA tenderness, left CVA tenderness, guarding or rebound.     Hernia: No hernia is present.  Musculoskeletal:        General: No swelling, tenderness, deformity or signs of injury. Normal range of motion.     Cervical back: Normal range of motion and neck supple.     Right lower leg: No edema.     Left lower leg: No edema.  Skin:    General: Skin is warm and dry.     Capillary Refill: Capillary refill takes less than 2 seconds.     Coloration: Skin is not jaundiced or pale.     Findings: No bruising, erythema, lesion or rash.  Neurological:     General: No focal deficit present.     Mental Status: He is alert and oriented to person, place, and time.     Cranial Nerves: No cranial nerve deficit.     Sensory: No sensory deficit.     Motor: No weakness.     Coordination: Coordination normal.     Gait: Gait normal.     Deep Tendon Reflexes: Reflexes normal.  Psychiatric:        Mood and Affect: Mood normal.        Behavior: Behavior normal.        Thought Content: Thought content normal.        Judgment: Judgment normal.         Assessment & Plan:  1. Routine general medical examination at a health care facility - needs to quit smoking  - Continue to eat healthy and exercise - Follow up in one year or sooner if needed - CBC with Differential/Platelet; Future - CMP; Future - Lipid panel; Future - TSH; Future  2. Tobacco use - Encouraged to quit completely.   3. Prostate cancer screening  - PSA; Future  4. Need for influenza vaccination  - Flu Vaccine QUAD 6+ mos PF IM (Fluarix Quad PF)   [name], NP

## 2019-04-14 NOTE — Patient Instructions (Signed)
It was great seeing you today   If you find the lab work that was done at work, please bring it to me. If not, then I have entered labs for you to have done. You just need to schedule a lab visit.

## 2019-04-22 ENCOUNTER — Other Ambulatory Visit: Payer: Self-pay

## 2019-04-22 ENCOUNTER — Other Ambulatory Visit (INDEPENDENT_AMBULATORY_CARE_PROVIDER_SITE_OTHER): Payer: BC Managed Care – PPO

## 2019-04-22 DIAGNOSIS — Z125 Encounter for screening for malignant neoplasm of prostate: Secondary | ICD-10-CM | POA: Diagnosis not present

## 2019-04-22 DIAGNOSIS — Z0000 Encounter for general adult medical examination without abnormal findings: Secondary | ICD-10-CM | POA: Diagnosis not present

## 2019-04-22 LAB — COMPREHENSIVE METABOLIC PANEL
ALT: 27 U/L (ref 0–53)
AST: 27 U/L (ref 0–37)
Albumin: 4.5 g/dL (ref 3.5–5.2)
Alkaline Phosphatase: 70 U/L (ref 39–117)
BUN: 12 mg/dL (ref 6–23)
CO2: 29 mEq/L (ref 19–32)
Calcium: 9.4 mg/dL (ref 8.4–10.5)
Chloride: 101 mEq/L (ref 96–112)
Creatinine, Ser: 1.04 mg/dL (ref 0.40–1.50)
GFR: 90.53 mL/min (ref >60.00)
Glucose, Bld: 83 mg/dL (ref 70–99)
Potassium: 3.8 mEq/L (ref 3.5–5.1)
Sodium: 137 mEq/L (ref 135–145)
Total Bilirubin: 1.6 mg/dL — ABNORMAL HIGH (ref 0.2–1.2)
Total Protein: 7.3 g/dL (ref 6.0–8.3)

## 2019-04-22 LAB — LIPID PANEL
Cholesterol: 160 mg/dL (ref 0–200)
HDL: 50.5 mg/dL (ref >39.00)
LDL Cholesterol: 85 mg/dL (ref 0–99)
NonHDL: 109.62
Total CHOL/HDL Ratio: 3
Triglycerides: 122 mg/dL (ref 0.0–149.0)
VLDL: 24.4 mg/dL (ref 0.0–40.0)

## 2019-04-22 LAB — CBC WITH DIFFERENTIAL/PLATELET
Basophils Absolute: 0 10*3/uL (ref 0.0–0.1)
Basophils Relative: 0.4 % (ref 0.0–3.0)
Eosinophils Absolute: 0.1 10*3/uL (ref 0.0–0.7)
Eosinophils Relative: 1.2 % (ref 0.0–5.0)
HCT: 41.5 % (ref 39.0–52.0)
Hemoglobin: 14.5 g/dL (ref 13.0–17.0)
Lymphocytes Relative: 48.6 % — ABNORMAL HIGH (ref 12.0–46.0)
Lymphs Abs: 3 10*3/uL (ref 0.7–4.0)
MCHC: 34.8 g/dL (ref 30.0–36.0)
MCV: 94.8 fl (ref 78.0–100.0)
Monocytes Absolute: 0.5 10*3/uL (ref 0.1–1.0)
Monocytes Relative: 7.8 % (ref 3.0–12.0)
Neutro Abs: 2.6 10*3/uL (ref 1.4–7.7)
Neutrophils Relative %: 42 % — ABNORMAL LOW (ref 43.0–77.0)
Platelets: 146 10*3/uL — ABNORMAL LOW (ref 150.0–400.0)
RBC: 4.38 Mil/uL (ref 4.22–5.81)
RDW: 13 % (ref 11.5–15.5)
WBC: 6.2 10*3/uL (ref 4.0–10.5)

## 2019-04-22 LAB — TSH: TSH: 2.68 u[IU]/mL (ref 0.35–4.50)

## 2019-04-22 LAB — PSA: PSA: 0.77 ng/mL (ref 0.10–4.00)

## 2019-06-19 ENCOUNTER — Other Ambulatory Visit: Payer: Self-pay | Admitting: Adult Health

## 2019-06-19 NOTE — Telephone Encounter (Signed)
Sent to the pharmacy by e-scribe.  Due for cpx 03/2020.

## 2019-07-29 ENCOUNTER — Telehealth: Payer: Self-pay | Admitting: Adult Health

## 2019-07-29 NOTE — Telephone Encounter (Signed)
Pt has alphagile allergy and wants to know if it would be ok to get the Covid Vaccine. Pt said you can leave a detailed vm or send him a mychart message with the answer.

## 2019-07-30 NOTE — Telephone Encounter (Signed)
He is able to get the vaccine

## 2019-07-30 NOTE — Telephone Encounter (Signed)
Pt notified he can receive vaccine.  Asked if he can come here to receive.  Advised him to sign up at [email or web address].  Nothing further needed.

## 2019-08-07 ENCOUNTER — Ambulatory Visit: Payer: BC Managed Care – PPO | Attending: Internal Medicine

## 2019-08-07 DIAGNOSIS — Z23 Encounter for immunization: Secondary | ICD-10-CM

## 2019-08-07 NOTE — Progress Notes (Signed)
   Covid-19 Vaccination Clinic  Name:  Parker Nguyen    MRN: [id number] DOB: [birth year]  08/07/2019  Mr. [name] was observed post Covid-19 immunization for 30 minutes based on pre-vaccination screening without incident. He was provided with Vaccine Information Sheet and instruction to access the V-Safe system.   Mr. [name] was instructed to call 911 with any severe reactions post vaccine: Marland Kitchen Difficulty breathing  . Swelling of face and throat  . A fast heartbeat  . A bad rash all over body  . Dizziness and weakness   Immunizations Administered    Name Date Dose VIS Date Route   Pfizer COVID-19 Vaccine 08/07/2019  3:16 PM 0.3 mL 04/03/2019 Intramuscular   Manufacturer: [place]   Lot: [id number]   [place]: [id number]

## 2019-08-31 ENCOUNTER — Ambulatory Visit: Payer: BC Managed Care – PPO | Attending: Internal Medicine

## 2019-08-31 DIAGNOSIS — Z23 Encounter for immunization: Secondary | ICD-10-CM

## 2019-08-31 NOTE — Progress Notes (Signed)
   Covid-19 Vaccination Clinic  Name:  Parker Nguyen    MRN: [id number] DOB: [birth year]  08/31/2019  [name] was observed post Covid-19 immunization for 30 minutes based on pre-vaccination screening without incident. He was provided with Vaccine Information Sheet and instruction to access the V-Safe system.   [name] was instructed to call 911 with any severe reactions post vaccine: Marland Kitchen Difficulty breathing  . Swelling of face and throat  . A fast heartbeat  . A bad rash all over body  . Dizziness and weakness   Immunizations Administered    Name Date Dose VIS Date Route   Pfizer COVID-19 Vaccine 08/31/2019  9:25 AM 0.3 mL 06/17/2018 Intramuscular   Manufacturer: [place]   Lot: [id number]   [place]: [id number]

## 2019-11-24 DIAGNOSIS — R0981 Nasal congestion: Secondary | ICD-10-CM | POA: Diagnosis not present

## 2019-11-27 DIAGNOSIS — F17200 Nicotine dependence, unspecified, uncomplicated: Secondary | ICD-10-CM | POA: Diagnosis not present

## 2019-11-27 DIAGNOSIS — J3081 Allergic rhinitis due to animal (cat) (dog) hair and dander: Secondary | ICD-10-CM | POA: Diagnosis not present

## 2019-11-27 DIAGNOSIS — T781XXD Other adverse food reactions, not elsewhere classified, subsequent encounter: Secondary | ICD-10-CM | POA: Diagnosis not present

## 2019-11-27 DIAGNOSIS — R21 Rash and other nonspecific skin eruption: Secondary | ICD-10-CM | POA: Diagnosis not present

## 2020-04-05 ENCOUNTER — Other Ambulatory Visit: Payer: Self-pay | Admitting: Adult Health

## 2020-05-11 ENCOUNTER — Other Ambulatory Visit: Payer: Self-pay

## 2020-05-11 NOTE — Progress Notes (Signed)
Subjective:    Patient ID: Parker Nguyen, male    DOB: 20-Jun-1965, 55 y.o.   MRN: [id number]  HPI  Patient presents for yearly preventative medicine examination. He is a pleasant 55 year old male who  has a past medical history of Alcohol use, Allergy to meat, and Anaphylaxis.  Tobacco Use - continues to smoke about 5-6 cigarettes per day.   All immunizations and health maintenance protocols were reviewed with the patient and needed orders were placed. He is due for flu shot   Appropriate screening laboratory values were ordered for the patient including screening of hyperlipidemia, renal function and hepatic function. If indicated by BPH, a PSA was ordered.  Medication reconciliation,  past medical history, social history, problem list and allergies were reviewed in detail with the patient  Goals were established with regard to weight loss, exercise, and  diet in compliance with medications. 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   No acute complaints    Review of Systems  Constitutional: Negative.   HENT: Negative.   Eyes: Negative.   Respiratory: Negative.   Cardiovascular: Negative.   Gastrointestinal: Negative.   Endocrine: Negative.   Genitourinary: Negative.   Musculoskeletal: Negative.   Skin: Negative.   Allergic/Immunologic: Negative.   Neurological: Negative.   Hematological: Negative.   Psychiatric/Behavioral: Negative.   All other systems reviewed and are negative.    Past Medical History:  Diagnosis Date  . Alcohol use   . Allergy to meat   . 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 Marital status: Married    Spouse name: Not on file  . Number of children: 6  . Years of education: Not on file  . Highest education level: Not on file  Occupational History  . Occupation:  shipping/receiving  Tobacco Use  . Smoking status: Light Tobacco Smoker    Packs/day: 0.25    Types: Cigarettes  . Smokeless tobacco: Never Used  . Tobacco comment: pack cigs last about 1 week per pt  Vaping Use  . Vaping Use: Never used  Substance and Sexual Activity  . Alcohol use: No    Alcohol/week: 3.0 - 5.0 standard drinks    Types: 3 - 5 Cans of beer per week    Comment: quit 07/24/16  . Drug use: No  . Sexual activity: Not on file  Other Topics Concern  . 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 ESOPHAGOGASTRODUODENOSCOPY (EGD) WITH PROPOFOL N/A 07/25/2016   Procedure: ESOPHAGOGASTRODUODENOSCOPY (EGD) WITH PROPOFOL;  Surgeon: Mauri Pole, MD;  Location: [facility];  Service: Endoscopy;  Laterality: N/A;  . FLEXIBLE SIGMOIDOSCOPY N/A 07/25/2016   Procedure: FLEXIBLE SIGMOIDOSCOPY;  Surgeon: Mauri Pole, MD;  Location: [facility];  Service: Endoscopy;  Laterality: N/A;  . Left arm fracture  1989  . SIGMOIDOSCOPY    . UPPER GASTROINTESTINAL ENDOSCOPY    .  WISDOM TOOTH EXTRACTION      Family History  Problem Relation Age of Onset  . Cancer Mother        Unsure type  . Hypertension Mother   . Diabetes Mother   . Congestive Heart Failure Mother   . Heart disease Father   . Kidney disease Father   . Diabetes Father   . Colon polyps Father   . Colon cancer Neg Hx   . Esophageal cancer Neg Hx   . Prostate cancer Neg Hx   . Rectal cancer Neg Hx   . Stomach cancer Neg Hx     Allergies  Allergen Reactions  . 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 cetirizine (ZYRTEC) 10 MG tablet TAKE 1 TABLET BY MOUTH EVERY DAY 90 tablet 3  . Flaxseed, Linseed, (FLAX SEEDS PO) Take by mouth.    . Multiple Vitamins-Minerals (CENTRUM SILVER 50+MEN PO) Take 1 tablet by mouth daily.    . Omega-3 Fatty Acids (FISH OIL) 1000 MG CAPS Take 1 capsule by mouth daily.    . TURMERIC PO Take by mouth.     No current facility-administered medications on file prior to visit.    BP 122/82   Temp 98.3 F (36.8 C)   Ht 5\' 9"  (1.753 m) Comment: WITH SHOES  Wt 180 lb (81.6 kg)   BMI 26.58 kg/m       Objective:   Physical Exam Vitals and nursing note reviewed.  Constitutional:      General: He is not in acute distress.    Appearance: Normal appearance. He is well-developed and normal weight.  HENT:     Head: Normocephalic and atraumatic.     Right Ear: Tympanic membrane, ear canal and external ear normal. There is no impacted cerumen.     Left Ear: Tympanic membrane, ear canal and external ear normal. There is no impacted cerumen.     Nose: Nose normal. No congestion or rhinorrhea.     Mouth/Throat:     Mouth: Mucous membranes are moist.     Pharynx: Oropharynx is clear. No oropharyngeal exudate or posterior oropharyngeal erythema.  Eyes:     General:        Right eye: No discharge.        Left eye: No discharge.     Extraocular Movements: Extraocular movements intact.     Conjunctiva/sclera: Conjunctivae normal.     Pupils: Pupils are equal, round, and reactive to light.  Neck:     Vascular: No carotid bruit.     Trachea: No tracheal deviation.  Cardiovascular:     Rate and Rhythm: Normal rate and regular rhythm.     Pulses: Normal pulses.     Heart sounds: Normal heart sounds. No murmur heard. No friction rub. No gallop.   Pulmonary:     Effort: Pulmonary effort is normal. No respiratory distress.     Breath sounds: Normal breath sounds. No stridor. No wheezing, rhonchi or rales.  Chest:     Chest wall: No  tenderness.  Abdominal:     General: Bowel sounds are normal. There is no distension.     Palpations: Abdomen is soft. There is no mass.     Tenderness: There is no abdominal tenderness. There is no right CVA tenderness, left CVA tenderness, guarding or rebound.     Hernia: No hernia is present.  Musculoskeletal:  General: No swelling, tenderness, deformity or signs of injury. Normal range of motion.     Right lower leg: No edema.     Left lower leg: No edema.  Lymphadenopathy:     Cervical: No cervical adenopathy.  Skin:    General: Skin is warm and dry.     Capillary Refill: Capillary refill takes less than 2 seconds.     Coloration: Skin is not jaundiced or pale.     Findings: No bruising, erythema, lesion or rash.  Neurological:     General: No focal deficit present.     Mental Status: He is alert and oriented to person, place, and time.     Cranial Nerves: No cranial nerve deficit.     Sensory: No sensory deficit.     Motor: No weakness.     Coordination: Coordination normal.     Gait: Gait normal.     Deep Tendon Reflexes: Reflexes normal.  Psychiatric:        Mood and Affect: Mood normal.        Behavior: Behavior normal.        Thought Content: Thought content normal.        Judgment: Judgment normal.       Assessment & Plan:  1. Routine general medical examination at a health care facility - Continue to stay active and exercise  - CBC with Differential/Platelet; Future - Comprehensive metabolic panel; Future - Lipid panel; Future - TSH; Future  2. Tobacco use - encouraged to quit smoking   3. Prostate cancer screening  - PSA; Future   [name], NP

## 2020-05-12 ENCOUNTER — Encounter: Payer: Self-pay | Admitting: Podiatry

## 2020-05-12 ENCOUNTER — Encounter: Payer: Self-pay | Admitting: Adult Health

## 2020-05-12 ENCOUNTER — Ambulatory Visit: Payer: BC Managed Care – PPO | Admitting: Podiatry

## 2020-05-12 ENCOUNTER — Ambulatory Visit (INDEPENDENT_AMBULATORY_CARE_PROVIDER_SITE_OTHER): Payer: BC Managed Care – PPO | Admitting: Adult Health

## 2020-05-12 VITALS — BP 122/82 | Temp 98.3°F | Ht 69.0 in | Wt 180.0 lb

## 2020-05-12 DIAGNOSIS — Z0000 Encounter for general adult medical examination without abnormal findings: Secondary | ICD-10-CM

## 2020-05-12 DIAGNOSIS — B070 Plantar wart: Secondary | ICD-10-CM | POA: Diagnosis not present

## 2020-05-12 DIAGNOSIS — L989 Disorder of the skin and subcutaneous tissue, unspecified: Secondary | ICD-10-CM | POA: Diagnosis not present

## 2020-05-12 DIAGNOSIS — Z720 Tobacco use: Secondary | ICD-10-CM

## 2020-05-12 DIAGNOSIS — Z125 Encounter for screening for malignant neoplasm of prostate: Secondary | ICD-10-CM

## 2020-05-12 DIAGNOSIS — Z23 Encounter for immunization: Secondary | ICD-10-CM

## 2020-05-12 LAB — LIPID PANEL
Cholesterol: 148 mg/dL (ref 0–200)
HDL: 38.3 mg/dL — ABNORMAL LOW (ref >39.00)
NonHDL: 109.82
Total CHOL/HDL Ratio: 4
Triglycerides: 250 mg/dL — ABNORMAL HIGH (ref 0.0–149.0)
VLDL: 50 mg/dL — ABNORMAL HIGH (ref 0.0–40.0)

## 2020-05-12 LAB — CBC WITH DIFFERENTIAL/PLATELET
Basophils Absolute: 0 10*3/uL (ref 0.0–0.1)
Basophils Relative: 0.7 % (ref 0.0–3.0)
Eosinophils Absolute: 0.1 10*3/uL (ref 0.0–0.7)
Eosinophils Relative: 1.5 % (ref 0.0–5.0)
HCT: 40.9 % (ref 39.0–52.0)
Hemoglobin: 14.1 g/dL (ref 13.0–17.0)
Lymphocytes Relative: 45.6 % (ref 12.0–46.0)
Lymphs Abs: 2.1 10*3/uL (ref 0.7–4.0)
MCHC: 34.5 g/dL (ref 30.0–36.0)
MCV: 96.3 fl (ref 78.0–100.0)
Monocytes Absolute: 0.4 10*3/uL (ref 0.1–1.0)
Monocytes Relative: 8.3 % (ref 3.0–12.0)
Neutro Abs: 2 10*3/uL (ref 1.4–7.7)
Neutrophils Relative %: 43.9 % (ref 43.0–77.0)
Platelets: 173 10*3/uL (ref 150.0–400.0)
RBC: 4.25 Mil/uL (ref 4.22–5.81)
RDW: 13 % (ref 11.5–15.5)
WBC: 4.6 10*3/uL (ref 4.0–10.5)

## 2020-05-12 LAB — COMPREHENSIVE METABOLIC PANEL
ALT: 26 U/L (ref 0–53)
AST: 27 U/L (ref 0–37)
Albumin: 4.6 g/dL (ref 3.5–5.2)
Alkaline Phosphatase: 80 U/L (ref 39–117)
BUN: 8 mg/dL (ref 6–23)
CO2: 29 mEq/L (ref 19–32)
Calcium: 9.5 mg/dL (ref 8.4–10.5)
Chloride: 102 mEq/L (ref 96–112)
Creatinine, Ser: 1 mg/dL (ref 0.40–1.50)
GFR: 85.83 mL/min (ref >60.00)
Glucose, Bld: 93 mg/dL (ref 70–99)
Potassium: 4.1 mEq/L (ref 3.5–5.1)
Sodium: 137 mEq/L (ref 135–145)
Total Bilirubin: 1.1 mg/dL (ref 0.2–1.2)
Total Protein: 7.4 g/dL (ref 6.0–8.3)

## 2020-05-12 LAB — TSH: TSH: 1.41 u[IU]/mL (ref 0.35–4.50)

## 2020-05-12 LAB — LDL CHOLESTEROL, DIRECT: Direct LDL: 68 mg/dL

## 2020-05-12 LAB — PSA: PSA: 0.47 ng/mL (ref 0.10–4.00)

## 2020-05-12 NOTE — Patient Instructions (Addendum)
It was great seeing you today!   We will follow up with you regarding your blood work   Please work on quit smoking   I will see you back in one year or sooner if needed

## 2020-05-12 NOTE — Patient Instructions (Signed)

## 2020-05-12 NOTE — Addendum Note (Signed)
Addended by: Marrion Coy on: 05/12/2020 08:31 AM   Modules accepted: Orders

## 2020-05-12 NOTE — Addendum Note (Signed)
Addended by: Miles Costain T on: 05/12/2020 08:25 AM   Modules accepted: Orders

## 2020-05-13 NOTE — Progress Notes (Signed)
Subjective:   Patient ID: Parker Nguyen, male   DOB: 55 y.o.   MRN: [id number]   HPI Patient presents stating he has had a painful lesion on the bottom of his left foot for several years and it makes it hard for him to walk and its been getting increasingly sore over the last 6 months.  Does not member specific injury patient does smoke quarter pack a day and likes to be active   Review of Systems  All other systems reviewed and are negative.       Objective:  Physical Exam Vitals and nursing note reviewed.  Constitutional:      Appearance: He is well-developed and well-nourished.  Cardiovascular:     Pulses: Intact distal pulses.  Pulmonary:     Effort: Pulmonary effort is normal.  Musculoskeletal:        General: Normal range of motion.  Skin:    General: Skin is warm.  Neurological:     Mental Status: He is alert.     Neurovascular status was found to be intact muscle strength was found to be within normal limits with patient found to have a large lesion plantar aspect left lateral foot measuring approximately 1.1 cm x 7 mm that is painful when pressed and painful to lateral pressure.  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  I explained procedure risk and the fact there is a chance this can recur.  Patient wants procedure understanding risk and today I infiltrated 60 mg Xylocaine with epinephrine sterile prep applied to the area and using sharp sterile instrumentation I completely excised the mass I then placed phenol on the base to kill remaining cells applied sterile dressing placed in formalin and sent for pathological evaluation and sterile dressing applied.  Gave instructions on soaks and reduced activity and reappoint to recheck as needed

## 2020-11-25 DIAGNOSIS — T781XXD Other adverse food reactions, not elsewhere classified, subsequent encounter: Secondary | ICD-10-CM | POA: Diagnosis not present

## 2020-11-25 DIAGNOSIS — F17200 Nicotine dependence, unspecified, uncomplicated: Secondary | ICD-10-CM | POA: Diagnosis not present

## 2020-11-25 DIAGNOSIS — J3081 Allergic rhinitis due to animal (cat) (dog) hair and dander: Secondary | ICD-10-CM | POA: Diagnosis not present

## 2020-11-25 DIAGNOSIS — R21 Rash and other nonspecific skin eruption: Secondary | ICD-10-CM | POA: Diagnosis not present

## 2020-11-29 ENCOUNTER — Emergency Department (HOSPITAL_BASED_OUTPATIENT_CLINIC_OR_DEPARTMENT_OTHER): Payer: BC Managed Care – PPO | Admitting: Radiology

## 2020-11-29 ENCOUNTER — Emergency Department (HOSPITAL_BASED_OUTPATIENT_CLINIC_OR_DEPARTMENT_OTHER)
Admission: EM | Admit: 2020-11-29 | Discharge: 2020-11-29 | Disposition: A | Discharge status: Home / Self Care | Payer: BC Managed Care – PPO | Attending: Emergency Medicine | Admitting: Emergency Medicine

## 2020-11-29 ENCOUNTER — Other Ambulatory Visit: Payer: Self-pay

## 2020-11-29 ENCOUNTER — Encounter (HOSPITAL_BASED_OUTPATIENT_CLINIC_OR_DEPARTMENT_OTHER): Payer: Self-pay

## 2020-11-29 DIAGNOSIS — Y9367 Activity, basketball: Secondary | ICD-10-CM | POA: Insufficient documentation

## 2020-11-29 DIAGNOSIS — F17210 Nicotine dependence, cigarettes, uncomplicated: Secondary | ICD-10-CM | POA: Insufficient documentation

## 2020-11-29 DIAGNOSIS — M79672 Pain in left foot: Secondary | ICD-10-CM | POA: Diagnosis not present

## 2020-11-29 DIAGNOSIS — X501XXA Overexertion from prolonged static or awkward postures, initial encounter: Secondary | ICD-10-CM | POA: Insufficient documentation

## 2020-11-29 DIAGNOSIS — S99922A Unspecified injury of left foot, initial encounter: Secondary | ICD-10-CM | POA: Diagnosis not present

## 2020-11-29 NOTE — ED Provider Notes (Signed)
[facility] EMERGENCY DEPT Provider Note  CSN: [id number] Arrival date & time: 11/29/20 2247  Chief Complaint(s) Foot Pain (Left)  HPI Parker Nguyen is a 55 y.o. male here for several weeks of heel pain usually felt in the morning.  Tonight patient was playing basketball when he made a sudden movement and felt a pop in his heel.  Since then he has had moderate to severe sharp/aching pain to the left heel.  Worse with ambulation and palpation of the heel.  Improved with immobilization.  No falls or trauma.  No wounds.  No other physical complaints.   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 Authorizing Provider  cetirizine (ZYRTEC) 10 MG tablet TAKE 1 TABLET BY MOUTH EVERY DAY 04/05/20   Nafziger, Tommi Rumps, NP  Flaxseed, Linseed, (FLAX SEEDS PO) Take by mouth.    [provider]  Multiple Vitamins-Minerals (CENTRUM SILVER 50+MEN PO) Take 1 tablet by mouth daily.    [provider]  Omega-3 Fatty Acids (FISH OIL) 1000 MG CAPS Take 1 capsule by mouth daily.    [provider]  TURMERIC PO Take by mouth.    [provider]                                                                                                                                    Past Surgical History Past Surgical History:  Procedure Laterality Date   ESOPHAGOGASTRODUODENOSCOPY (EGD) WITH PROPOFOL N/A 07/25/2016   Procedure: ESOPHAGOGASTRODUODENOSCOPY (EGD) WITH PROPOFOL;  Surgeon: Mauri Pole, MD;  Location: [facility];  Service: Endoscopy;  Laterality: N/A;   FLEXIBLE  SIGMOIDOSCOPY N/A 07/25/2016   Procedure: FLEXIBLE SIGMOIDOSCOPY;  Surgeon: Mauri Pole, MD;  Location: [facility];  Service: Endoscopy;  Laterality: N/A;   Left arm fracture  1989   SIGMOIDOSCOPY     UPPER GASTROINTESTINAL ENDOSCOPY     WISDOM TOOTH EXTRACTION     Family History Family History  Problem Relation Age of Onset   Cancer Mother        Unsure type   Hypertension Mother    Diabetes Mother    Congestive Heart Failure Mother    Heart disease Father    Kidney disease Father    Diabetes Father    Colon polyps Father    Colon cancer Neg Hx    Esophageal cancer Neg Hx    Prostate cancer Neg Hx    Rectal cancer Neg Hx    Stomach cancer  Neg Hx     Social History Social History   Tobacco Use   Smoking status: Light Smoker    Packs/day: 0.25    Types: Cigarettes   Smokeless tobacco: Never   Tobacco comments:    pack cigs last about 1 week per pt  Vaping Use   Vaping Use: Never used  Substance Use Topics   Alcohol use: No    Alcohol/week: 3.0 - 5.0 standard drinks    Types: 3 - 5 Cans of beer per week    Comment: quit 07/24/16   Drug use: No   Allergies 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  Constitutional:      General: He is not in acute distress.    Appearance: He is well-developed. He is not diaphoretic.  HENT:     Head: Normocephalic and atraumatic.     Right Ear: External ear normal.     Left Ear: External ear normal.     Nose: Nose normal.     Mouth/Throat:     Mouth: Mucous membranes are moist.  Eyes:     General: No scleral icterus.    Conjunctiva/sclera: Conjunctivae  normal.  Neck:     Trachea: Phonation normal.  Cardiovascular:     Rate and Rhythm: Normal rate and regular rhythm.  Pulmonary:     Effort: Pulmonary effort is normal. No respiratory distress.     Breath sounds: No stridor.  Abdominal:     General: There is no distension.  Musculoskeletal:        General: Normal range of motion.     Cervical back: Normal range of motion.     Left ankle: No swelling or deformity. No tenderness. Normal range of motion.     Left Achilles Tendon: No tenderness.     Left foot: Normal range of motion. Tenderness (heel pad, left) present. No swelling or laceration. Normal pulse.       Feet:  Neurological:     Mental Status: He is alert and oriented to person, place, and time.  Psychiatric:        Behavior: Behavior normal.    ED Results and Treatments Labs (all labs ordered are listed, but only abnormal results are displayed) Labs Reviewed - No data to display                                                                                                                       EKG  EKG Interpretation  Date/Time:    Ventricular  Rate:    PR Interval:    QRS Duration:   QT Interval:    QTC Calculation:   R Axis:     Text Interpretation:         Radiology DG Foot Complete Left  Result Date: 11/29/2020 CLINICAL DATA:  Left foot injury, pain EXAM: LEFT FOOT - COMPLETE 3+ VIEW COMPARISON:  None. FINDINGS: Plantar calcaneal spur. No acute bony abnormality. Specifically, no fracture, subluxation, or dislocation. Joint spaces maintained. Soft tissues are intact. IMPRESSION: No acute bony abnormality. Electronically Signed   By: Rolm Baptise M.D.   On: 11/29/2020 23:24    Pertinent labs & imaging results that were available during my care of the patient were reviewed by me and considered in my medical decision making (see MDM for details).  Medications Ordered in ED Medications - No data to display                                                                                                                                    Procedures Procedures  (including critical care time)  Medical Decision Making / ED Course I have reviewed the nursing notes for this encounter and the patient's prior records (if available in EHR or on provided paperwork).  Parker Nguyen was evaluated in Emergency Department on 11/29/2020 for the symptoms described in the history of present illness. He was evaluated in the context of the global COVID-19 pandemic, which necessitated consideration that the patient might be at risk for infection with the SARS-CoV-2 virus that causes COVID-19. Institutional protocols and algorithms that pertain to the evaluation of patients at risk for COVID-19 are in a state of rapid change based on information released by regulatory bodies including the CDC and federal and state organizations. These policies and algorithms were followed during the patient's care in the ED.     Bone spur vs tendon or ligamental tear.  Pertinent labs & imaging results that were available during my care of the patient were reviewed by me and considered in my medical decision making:  Plain film notable for heel spur. Full ROM. No achilles pain. NVI. Patient declined pain meds or boot. Has a podiatrist.  Recommended Podiatry or Ortho f/u  Final Clinical Impression(s) / ED Diagnoses Final diagnoses:  Pain of left heel    The patient appears reasonably screened and/or stabilized for discharge and I doubt any other medical condition or other [facility] requiring further screening, evaluation, or treatment in the ED at this time prior to discharge. Safe for discharge with strict return precautions.  Disposition: Discharge  Condition: Good  I have discussed the results, Dx and Tx plan with the patient/family who expressed understanding and agree(s) with the plan. Discharge instructions discussed at length. The patient/family was given strict return  precautions who verbalized understanding of the instructions. No further questions at time of discharge.    ED Discharge Orders  None        Follow Up: [name], NP [address] [place] [address] [phone]  Call  to schedule an appointment for close follow up    This chart was dictated using voice recognition software.  Despite best efforts to proofread,  errors can occur which can change the documentation meaning.    Fatima Blank, MD 11/29/20 2342

## 2020-11-29 NOTE — ED Triage Notes (Signed)
Patient here POV from Home with Foot Injury (Left).  Patient states he was playing basketball approximately 3 hours PTA when he heard a "Pop" and began experiencing pain to the Distal Area of the Left Foot.   No Obvious Swelling noted. No Pain to Ankle. Ambulatory with Personal Cane. GCS 15.

## 2021-02-14 DIAGNOSIS — B353 Tinea pedis: Secondary | ICD-10-CM | POA: Diagnosis not present

## 2021-03-22 ENCOUNTER — Other Ambulatory Visit: Payer: Self-pay | Admitting: Adult Health

## 2021-04-05 ENCOUNTER — Encounter: Payer: Self-pay | Admitting: Podiatry

## 2021-04-05 ENCOUNTER — Other Ambulatory Visit: Payer: Self-pay

## 2021-04-05 ENCOUNTER — Ambulatory Visit (INDEPENDENT_AMBULATORY_CARE_PROVIDER_SITE_OTHER): Payer: BC Managed Care – PPO | Admitting: Podiatry

## 2021-04-05 DIAGNOSIS — Q828 Other specified congenital malformations of skin: Secondary | ICD-10-CM

## 2021-04-05 NOTE — Progress Notes (Signed)
°  Subjective:  Patient ID: Parker Nguyen, male    DOB: [birth year],   MRN: [id number]  Chief Complaint  Patient presents with   Warts    Re-occurrence of lesion at LT plantar x 3 mo - 8/10 sharp pains - no redness/swelling/draiange -gait issues due to lesion -w/ limping tx: none    55 y.o. male presents for reoccurance of a plantar left lesion that has been present for about 3 months. States he got good relief last time when Dr. Paulla Dolly cut out the core. Patient hoping for the same today . Denies any other pedal complaints. Denies n/v/f/c.   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 CFT <3 seconds. Normal hair growth on digits. No edema.  Skin. No lacerations or abrasions bilateral feet. Cored hyperkeratotic lesion noted to plantar lateral left foot.  Musculoskeletal: MMT 5/5 bilateral lower extremities in DF, PF, Inversion and Eversion. Deceased ROM in DF of ankle joint.  Neurological: Sensation intact to light touch.   Assessment:   1. Porokeratosis      Plan:  Patient was evaluated and treated and all questions answered. -Discussed corns and calluses with patient and treatment options.  -Hyperkeratotic tissue was debrided with chisel without incident.  -Applied salycylic acid treatment to area with dressing. Advised to remove bandaging tomorrow.  -Encouraged daily moisturizing -Discussed use of pumice stone -Advised good supportive shoes and inserts -Patient to return to office as needed or sooner if condition worsens.   Lorenda Peck, DPM

## 2021-05-24 ENCOUNTER — Ambulatory Visit (INDEPENDENT_AMBULATORY_CARE_PROVIDER_SITE_OTHER): Payer: BC Managed Care – PPO | Admitting: Adult Health

## 2021-05-24 ENCOUNTER — Encounter: Payer: Self-pay | Admitting: Adult Health

## 2021-05-24 VITALS — BP 122/86 | HR 61 | Temp 98.6°F | Ht 69.0 in | Wt 183.0 lb

## 2021-05-24 DIAGNOSIS — R6882 Decreased libido: Secondary | ICD-10-CM

## 2021-05-24 DIAGNOSIS — Z0000 Encounter for general adult medical examination without abnormal findings: Secondary | ICD-10-CM | POA: Diagnosis not present

## 2021-05-24 DIAGNOSIS — Z23 Encounter for immunization: Secondary | ICD-10-CM

## 2021-05-24 DIAGNOSIS — Z720 Tobacco use: Secondary | ICD-10-CM | POA: Diagnosis not present

## 2021-05-24 DIAGNOSIS — Z125 Encounter for screening for malignant neoplasm of prostate: Secondary | ICD-10-CM | POA: Diagnosis not present

## 2021-05-24 LAB — CBC WITH DIFFERENTIAL/PLATELET
Basophils Absolute: 0 10*3/uL (ref 0.0–0.1)
Basophils Relative: 0.9 % (ref 0.0–3.0)
Eosinophils Absolute: 0.1 10*3/uL (ref 0.0–0.7)
Eosinophils Relative: 2.9 % (ref 0.0–5.0)
HCT: 42.1 % (ref 39.0–52.0)
Hemoglobin: 14.1 g/dL (ref 13.0–17.0)
Lymphocytes Relative: 33.1 % (ref 12.0–46.0)
Lymphs Abs: 1.5 10*3/uL (ref 0.7–4.0)
MCHC: 33.5 g/dL (ref 30.0–36.0)
MCV: 95.5 fl (ref 78.0–100.0)
Monocytes Absolute: 0.4 10*3/uL (ref 0.1–1.0)
Monocytes Relative: 8.2 % (ref 3.0–12.0)
Neutro Abs: 2.5 10*3/uL (ref 1.4–7.7)
Neutrophils Relative %: 54.9 % (ref 43.0–77.0)
Platelets: 143 10*3/uL — ABNORMAL LOW (ref 150.0–400.0)
RBC: 4.41 Mil/uL (ref 4.22–5.81)
RDW: 13.5 % (ref 11.5–15.5)
WBC: 4.6 10*3/uL (ref 4.0–10.5)

## 2021-05-24 LAB — COMPREHENSIVE METABOLIC PANEL
ALT: 23 U/L (ref 0–53)
AST: 25 U/L (ref 0–37)
Albumin: 4.7 g/dL (ref 3.5–5.2)
Alkaline Phosphatase: 74 U/L (ref 39–117)
BUN: 11 mg/dL (ref 6–23)
CO2: 31 mEq/L (ref 19–32)
Calcium: 9.9 mg/dL (ref 8.4–10.5)
Chloride: 103 mEq/L (ref 96–112)
Creatinine, Ser: 1.03 mg/dL (ref 0.40–1.50)
GFR: 82.24 mL/min (ref >60.00)
Glucose, Bld: 90 mg/dL (ref 70–99)
Potassium: 4.3 mEq/L (ref 3.5–5.1)
Sodium: 140 mEq/L (ref 135–145)
Total Bilirubin: 1.6 mg/dL — ABNORMAL HIGH (ref 0.2–1.2)
Total Protein: 7.6 g/dL (ref 6.0–8.3)

## 2021-05-24 LAB — LIPID PANEL
Cholesterol: 150 mg/dL (ref 0–200)
HDL: 40.5 mg/dL (ref >39.00)
LDL Cholesterol: 77 mg/dL (ref 0–99)
NonHDL: 109.58
Total CHOL/HDL Ratio: 4
Triglycerides: 161 mg/dL — ABNORMAL HIGH (ref 0.0–149.0)
VLDL: 32.2 mg/dL (ref 0.0–40.0)

## 2021-05-24 LAB — HEMOGLOBIN A1C: Hgb A1c MFr Bld: 5.4 % (ref 4.6–6.5)

## 2021-05-24 LAB — PSA: PSA: 0.53 ng/mL (ref 0.10–4.00)

## 2021-05-24 LAB — TSH: TSH: 1.49 u[IU]/mL (ref 0.35–5.50)

## 2021-05-24 MED ORDER — VARENICLINE TARTRATE 1 MG PO TABS: 1.0000 mg | ORAL_TABLET | Freq: Two times a day (BID) | ORAL | 3 refills | Status: AC

## 2021-05-24 MED ORDER — VARENICLINE TARTRATE 0.5 MG X 11 & 1 MG X 42 PO TBPK: ORAL_TABLET | ORAL | 0 refills | Status: DC

## 2021-05-24 NOTE — Patient Instructions (Signed)
It was great seeing you today   We will follow up with you regarding your lab work   Please let me know if you need anything   Please let me know if you have any issues with Chantix

## 2021-05-24 NOTE — Progress Notes (Signed)
Subjective:    Patient ID: Parker Nguyen, male    DOB: 1965-07-04, 56 y.o.   MRN: [id number]  HPI  Patient presents for yearly preventative medicine examination. He is a pleasant 56 year old male who  has a past medical history of Alcohol use, Allergy to meat, and Anaphylaxis.  Tobacco Use - continues to smoke about 5-6 cigarettes per day. He is ready to quit and would like to start chantix.   Loss of libido- feels like he has a less desire for sex with his partner. ? Sluggish feeling, mild decrease erections.    All immunizations and health maintenance protocols were reviewed with the patient and needed orders were placed.  Appropriate screening laboratory values were ordered for the patient including screening of hyperlipidemia, renal function and hepatic function. If indicated by BPH, a PSA was ordered.  Medication reconciliation,  past medical history, social history, problem list and allergies were reviewed in detail with the patient  Goals were established with regard to weight loss, exercise, and  diet in compliance with medications. He stays active with boxing and eats a heart healthy diet   Wt Readings from Last 3 Encounters:  05/24/21 183 lb (83 kg)  11/29/20 177 lb (80.3 kg)  05/12/20 180 lb (81.6 kg)   He is up to date on routine colon cancer screening   He has no acute complaints.   Review of Systems  Constitutional: Negative.   HENT: Negative.    Eyes: Negative.   Respiratory: Negative.    Cardiovascular: Negative.   Gastrointestinal: Negative.   Endocrine: Negative.   Genitourinary: Negative.   Musculoskeletal: Negative.   Skin: Negative.   Allergic/Immunologic: Negative.   Neurological: Negative.   Hematological: Negative.   Psychiatric/Behavioral: Negative.    All other systems reviewed and are negative.  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 ,2018    Social History    Socioeconomic History   Marital status: Married    Spouse name: Not on file   Number of children: 6   Years of education: Not on file   Highest education level: Not on file  Occupational History   Occupation: shipping/receiving  Tobacco Use   Smoking status: Light Smoker    Packs/day: 0.25    Types: Cigarettes   Smokeless tobacco: Never   Tobacco comments:    pack cigs last about 1 week per pt  Vaping Use   Vaping Use: Never used  Substance and Sexual Activity   Alcohol use: No    Alcohol/week: 3.0 - 5.0 standard drinks    Types: 3 - 5 Cans of beer per week    Comment: quit 07/24/16   Drug use: No   Sexual activity: Not on file  Other Topics Concern   Not on file  Social History Narrative   Works in Scientist, research (life sciences) and Receiving    Social Determinants of Health   Financial Resource Strain: Not on file  Food Insecurity: Not on file  Transportation Needs: Not on file  Physical Activity: Not on file  Stress: Not on file  Social Connections: Not on file  Intimate Partner Violence: Not on file    Past Surgical History:  Procedure Laterality Date   ESOPHAGOGASTRODUODENOSCOPY (EGD) WITH PROPOFOL N/A 07/25/2016   Procedure: ESOPHAGOGASTRODUODENOSCOPY (EGD) WITH PROPOFOL;  Surgeon: Mauri Pole, MD;  Location: [facility];  Service: Endoscopy;  Laterality: N/A;   FLEXIBLE SIGMOIDOSCOPY N/A 07/25/2016   Procedure: FLEXIBLE SIGMOIDOSCOPY;  Surgeon: Mauri Pole, MD;  Location: [facility];  Service: Endoscopy;  Laterality: N/A;   Left arm fracture  1989   SIGMOIDOSCOPY     UPPER GASTROINTESTINAL ENDOSCOPY     WISDOM TOOTH EXTRACTION      Family History  Problem Relation Age of Onset   Cancer Mother        Unsure type   Hypertension Mother    Diabetes Mother    Congestive Heart Failure Mother    Heart disease Father    Kidney disease Father    Diabetes Father    Colon polyps Father    Colon cancer Neg Hx    Esophageal cancer Neg Hx    Prostate cancer Neg Hx     Rectal cancer Neg Hx    Stomach cancer Neg Hx     Allergies  Allergen Reactions   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     Multiple Vitamins-Minerals (CENTRUM SILVER 50+MEN PO) Take 1 tablet by mouth daily.     Omega-3 Fatty Acids (FISH OIL) 1000 MG CAPS Take 1 capsule by mouth daily.     TURMERIC PO Take by mouth.     No current facility-administered medications on file prior to visit.    BP 122/86    Pulse 61    Temp 98.6 F (37 C) (Oral)    Ht 5\' 9"  (1.753 m)    Wt 183 lb (83 kg)    SpO2 99%    BMI 27.02 kg/m        Objective:   Physical Exam Vitals and nursing note reviewed.  Constitutional:      General: He is not in acute distress.    Appearance: Normal appearance. He is well-developed and normal weight.  HENT:     Head: Normocephalic and atraumatic.     Right Ear: Tympanic membrane, ear canal and external ear normal. There is no impacted cerumen.     Left Ear: Tympanic membrane, ear canal and external ear normal. There is no impacted cerumen.     Nose: Nose normal. No congestion or rhinorrhea.     Mouth/Throat:     Mouth: Mucous membranes are moist.     Pharynx: Oropharynx is clear. No oropharyngeal exudate or posterior oropharyngeal erythema.  Eyes:     General:        Right eye: No discharge.        Left eye: No discharge.     Extraocular Movements: Extraocular movements intact.     Conjunctiva/sclera: Conjunctivae normal.     Pupils: Pupils are equal, round, and reactive to light.  Neck:     Vascular: No carotid bruit.     Trachea: No tracheal deviation.  Cardiovascular:     Rate and Rhythm: Normal rate and regular rhythm.     Pulses: Normal pulses.     Heart sounds: Normal heart sounds. No murmur heard.   No friction rub. No gallop.   Pulmonary:     Effort: Pulmonary effort is normal. No respiratory distress.     Breath sounds: Normal breath sounds. No stridor. No wheezing, rhonchi or rales.  Chest:     Chest wall: No tenderness.  Abdominal:     General: Bowel sounds are normal. There is no distension.     Palpations: Abdomen is soft. There is no mass.     Tenderness: There is no abdominal tenderness. There is no right CVA tenderness, left CVA tenderness, guarding or rebound.     Hernia: No hernia is present.  Musculoskeletal:        General: No swelling, tenderness, deformity or signs of injury. Normal range of motion.     Right lower leg: No edema.     Left lower leg: No edema.  Lymphadenopathy:     Cervical: No cervical adenopathy.  Skin:    General: Skin is warm and dry.     Capillary Refill: Capillary refill takes less than 2 seconds.     Coloration: Skin is not jaundiced or pale.     Findings: No bruising, erythema, lesion or rash.  Neurological:     General: No focal deficit present.     Mental Status: He is alert and oriented to person, place, and time.     Cranial Nerves: No cranial nerve deficit.     Sensory: No sensory deficit.     Motor: No weakness.     Coordination: Coordination normal.     Gait: Gait normal.     Deep Tendon Reflexes: Reflexes normal.  Psychiatric:        Mood and Affect: Mood normal.        Behavior: Behavior normal.        Thought Content: Thought content normal.        Judgment: Judgment normal.      Assessment & Plan:  1. Routine general medical examination at a health care facility - Continue to exercise and eat healthy  - Follow up in one year or sooner if needed - CBC with Differential/Platelet; Future - Comprehensive metabolic panel; Future - Hemoglobin A1c; Future - Lipid panel; Future - TSH; Future  2. Tobacco use - Congratulated on taking the first step in quitting smoking  - varenicline (CHANTIX PAK) 0.5 MG X 11 & 1 MG X 42 tablet; Take one 0.5 mg tablet  by mouth once daily for 3 days, then increase to one 0.5 mg tablet twice daily for 4 days, then increase to one 1 mg tablet twice daily.  Dispense: 53 tablet; Refill: 0 - varenicline (CHANTIX CONTINUING MONTH PAK) 1 MG tablet; Take 1 tablet (1 mg total) by mouth 2 (two) times daily.  Dispense: 60 tablet; Refill: 3  3. Prostate cancer screening  - PSA; Future  4. Need for shingles vaccine  - Varicella-zoster vaccine IM  5. Loss of libido  - Testosterone,Free and Total  [name], NP

## 2021-05-26 LAB — TESTOSTERONE,FREE AND TOTAL
Testosterone, Free: 16.9 pg/mL (ref 7.2–24.0)
Testosterone: 522 ng/dL (ref 264–916)

## 2021-07-24 ENCOUNTER — Ambulatory Visit (INDEPENDENT_AMBULATORY_CARE_PROVIDER_SITE_OTHER): Payer: BC Managed Care – PPO

## 2021-07-24 DIAGNOSIS — Z23 Encounter for immunization: Secondary | ICD-10-CM

## 2021-09-06 ENCOUNTER — Encounter: Payer: Self-pay | Admitting: Podiatry

## 2021-09-06 ENCOUNTER — Ambulatory Visit: Payer: BC Managed Care – PPO | Admitting: Podiatry

## 2021-09-06 DIAGNOSIS — L280 Lichen simplex chronicus: Secondary | ICD-10-CM | POA: Diagnosis not present

## 2021-09-06 DIAGNOSIS — B079 Viral wart, unspecified: Secondary | ICD-10-CM | POA: Diagnosis not present

## 2021-09-06 DIAGNOSIS — B070 Plantar wart: Secondary | ICD-10-CM | POA: Diagnosis not present

## 2021-09-06 NOTE — Patient Instructions (Signed)
Wart Surgery-Directions for Home Care  You will need: Dial antibacterial hand soap,  gauze,  Band-aids  Keep the original bandage on until the following morning.  Bathe or shower with the bandage on allowing it to soak, so that when removed it won't stick to the wound. After showering or bathing, remove the old bandage and cleanse the area with Dial soap and water.  Place a few drops of Dial soap and water on a piece of guaze and gently scrub the area.  Dry with a clean piece of gauze. Apply antibiotic cream (polysporin, triple antibiotic or similar) to the area and place a clean square gauze bandage over and cover with a band-aid. In the evening, add a few drops of Dial soap to a basin of lukewarm water and soak your foot for 15 minutes.  After soaking, follow the instructions above for cleaning the area. Continue cleansing the area as described above two times a day, applying sterile gauze dressings until the doctor informs you that it is not needed. The time required to heal the surgical site will depend upon the size and location of the wart.  Lesions under bony prominences heal slower.  The average healing time is 2 to 4 weeks. Take over the counter Ibuprofen or Tylenol as needed should you experience any discomfort If you do experience discomfort after surgery, keep the foot elevated and apply an ice pack over your ankle, 30 minutes on, 30 minutes off each hour for the rest of the day. If you have any questions , please do not hesitate to contact the office.  WARTS (Verrucae)  Warts are caused by a virus that has invaded the skin.  They are more common in young adults and children and a small percentage will resolve on their own.  There are many types of warts including mosaic warts (large flat), vulgaris (domed warts-have pearl like appearance), and plantar warts (flat or cauliflower like appearance).  Warts are highly contagious and may be picked up from any surface.  Warts thrive in a warm  moist environment and are common near pools, showers, and locker room floors.  Any microscopic cut in the skin is where the virus enters and becomes a wart.  Warts are very difficult to treat and get rid of.  Patience is necessary in the treatment of this virus.  It may take months to cure and different methods may have to be used to get rid of your wart.  Standard Initial Treatment is: Periodic debridement of the wart and application of Canthacur to each lesion (a blistering agent that will slough off the warty skin) Dispensing of topical treatments/prescriptions to apply to the wart at home  Other options include: Excision of the lesion-numbing the skin around the wart and cutting it out-requires daily soaks post-operatively and takes about 2-3 weeks to fully heal Excision with CO2 Laser-Performed at the surgical center your foot is numbed up and the lesions are all cut out and then lasered with a high power laser.  Very good for multiple warts that are resistant. Cimetidine (Tagamet)-Oral agent used in high does--has shown better results in children  How do I apply the standard topical treatments?  Salicylic Acid (Compound W wart remover liquid or gel-available at drug or grocery stores)-Apply a dime size thickness over the wart and cover with duct tape-apply at night so the medication does not spread out to the good skin.  The skin will turn white and slowly blister off.  Use a   pumice stone daily to remove the white skin as best you can.  If the skin gets too raw and painful, discontinue for a few days then resume. Aldara (Imiquimod)-this is an immune response modifier.  They come in little packets so try to get at least 2 days out of each packet if you can.  Apply a small amount to the lesion and cover with duct tape.  Do not rub it in-let it absorb on its own.  Good to apply each morning.  Other Helpful Hints: Wash shoes that can be washed in the washing machine 2-3 x per month with some  bleach Use Lysol in shoes that cannot be washed and wipe out with a cloth 1 x per week-allow to dry for 8 hours before wearing again Use a bleach solution (1 part bleach to 3 parts water) in your tub or shower to reduce the spread of the virus to yourself and others Use aqua socks or clean sandals when at the pool or locker room to reduce the chance of picking up the virus or spreading it to others  

## 2021-09-07 NOTE — Progress Notes (Signed)
Subjective:   Patient ID: Parker Nguyen, male   DOB: 56 y.o.   MRN: [id number]   HPI Patient presents with chronic plantar lesion left foot that was debrided and only got better for short period of time.  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  I did explain it may recur there is no guarantees this will solve it but given his low amount of discomfort failure to respond to treatment I think it is the best chance we have.  Patient wants procedure understands risk and today I infiltrated the area 60 mg Xylocaine Marcaine with epinephrine I then did sterile prep and I did complete excision of the mass I did see what appears to be a verruca's portion to this in the plantar when removed and I did send off for pathology evaluation placing it in formalin.  I applied a small amount of phenol to the base I applied sterile dressing I dispensed padding to keep pressure off this and I discussed elevation and oral anti-inflammatories.  Reappoint to recheck and we will get the results of pathology and make evaluation

## 2021-11-16 ENCOUNTER — Encounter: Payer: Self-pay | Admitting: Gastroenterology

## 2022-01-17 ENCOUNTER — Ambulatory Visit (INDEPENDENT_AMBULATORY_CARE_PROVIDER_SITE_OTHER): Payer: BC Managed Care – PPO | Admitting: Adult Health

## 2022-01-17 VITALS — BP 118/82 | HR 74 | Temp 98.6°F | Wt 184.0 lb

## 2022-01-17 DIAGNOSIS — K5900 Constipation, unspecified: Secondary | ICD-10-CM

## 2022-01-17 DIAGNOSIS — R0789 Other chest pain: Secondary | ICD-10-CM | POA: Diagnosis not present

## 2022-01-17 NOTE — Progress Notes (Signed)
Subjective:    Patient ID: Parker Nguyen, male    DOB: 09-11-1965, 56 y.o.   MRN: [id number]  HPI 56 year old male who  has a past medical history of Alcohol use, Allergy to meat, and Anaphylaxis.  He presents to the office today for an acute issue   He reports that about 2-3 weeks ago he had some " indigestion" that lasted about 4-5 days and then resolved. He was not having his normal bowel movements and felt more constipated, was burping less than normal and his stomach felt full/blaoted. He denies nausea or vomiting. Symptoms have since resolves with changes in diet.   Four days ago, after he woke up in the morning he felt an uncomfortable sensation in his chest that he cannot describe. He denies pain or fhis normal palpitations, or tightness. This lasted a few minutes and then resolved and has not presented again.    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 ,2018    Social History   Socioeconomic History   Marital status: Married    Spouse name: Not on file   Number of children: 6   Years of education: Not on file   Highest education level: Not on file  Occupational History   Occupation: shipping/receiving  Tobacco Use   Smoking status: Light Smoker    Packs/day: 0.25    Types: Cigarettes   Smokeless tobacco: Never   Tobacco comments:    pack cigs last about 1 week per pt  Vaping Use   Vaping Use: Never used  Substance and Sexual Activity   Alcohol use: No    Alcohol/week: 3.0 - 5.0 standard drinks of alcohol    Types: 3 - 5 Cans of beer per week    Comment: quit 07/24/16   Drug use: No   Sexual activity: Not on file  Other Topics Concern   Not on file  Social History Narrative   Works in Scientist, research (life sciences) and Receiving    Social Determinants of Health   Financial Resource Strain: Not on file  Food Insecurity: Not on file  Transportation Needs: Not on file   Physical Activity: Not on file  Stress: Not on file  Social Connections: Not on file  Intimate Partner Violence: Not on file    Past Surgical History:  Procedure Laterality Date   ESOPHAGOGASTRODUODENOSCOPY (EGD) WITH PROPOFOL N/A 07/25/2016   Procedure: ESOPHAGOGASTRODUODENOSCOPY (EGD) WITH PROPOFOL;  Surgeon: Mauri Pole, MD;  Location: [facility];  Service: Endoscopy;  Laterality: N/A;   FLEXIBLE SIGMOIDOSCOPY N/A 07/25/2016   Procedure: FLEXIBLE SIGMOIDOSCOPY;  Surgeon: Mauri Pole, MD;  Location: [facility];  Service: Endoscopy;  Laterality: N/A;   Left arm fracture  1989   SIGMOIDOSCOPY     UPPER GASTROINTESTINAL ENDOSCOPY     WISDOM TOOTH EXTRACTION      Family History  Problem Relation Age of Onset   Cancer Mother        Unsure type   Hypertension Mother    Diabetes Mother    Congestive Heart Failure Mother    Heart disease Father    Kidney disease Father    Diabetes Father    Colon polyps Father    Colon cancer Neg Hx    Esophageal cancer Neg Hx    Prostate cancer Neg Hx    Rectal cancer  Neg Hx    Stomach cancer Neg Hx     Allergies  Allergen Reactions   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     Omega-3 Fatty Acids (FISH OIL) 1000 MG CAPS Take 1 capsule by mouth daily.     TURMERIC PO Take by mouth.     Flaxseed, Linseed, (FLAX SEEDS PO) Take by mouth. (Patient not taking: Reported on 01/17/2022)     varenicline (CHANTIX PAK) 0.5 MG X 11 & 1 MG X 42 tablet Take one 0.5 mg tablet by mouth once daily for 3 days, then increase to one 0.5 mg tablet twice daily for 4 days, then increase to one 1 mg tablet twice daily. (Patient not taking: Reported on 01/17/2022) 53 tablet 0   No current facility-administered  medications on file prior to visit.    BP 118/82 (BP Location: Left Arm, Patient Position: Sitting, Cuff Size: Large)   Pulse 74   Temp 98.6 F (37 C) (Oral)   Wt 184 lb (83.5 kg)   SpO2 95%   BMI 27.17 kg/m       Objective:   Physical Exam Cardiovascular:     Rate and Rhythm: Normal rate and regular rhythm. Occasional Extrasystoles are present.    Pulses: Normal pulses.     Heart sounds: Normal heart sounds.  Pulmonary:     Effort: Pulmonary effort is normal.     Breath sounds: Normal breath sounds.  Abdominal:     General: Abdomen is flat. Bowel sounds are normal. There is no distension.     Palpations: Abdomen is soft. There is no mass.     Tenderness: There is no abdominal tenderness. There is no guarding or rebound.     Hernia: No hernia is present.  Skin:    General: Skin is warm and dry.     Capillary Refill: Capillary refill takes less than 2 seconds.  Neurological:     General: No focal deficit present.     Mental Status: He is oriented to person, place, and time.  Psychiatric:        Mood and Affect: Mood normal.        Behavior: Behavior normal.        Thought Content: Thought content normal.        Judgment: Judgment normal.       Assessment & Plan:  1. Chest discomfort  - EKG 12-Lead- SR with frequent ectopic beats, non specifific T wave abnormality, Rate 74. Consistent with previous EKG.  - Unknown cause but reassuring that it has not presented since.  - Did not want to do blood work today  - Advised follow up if symptoms return   2. Constipation, unspecified constipation type - Resolved   Parker Peng, NP  Time spent with patient today was 33 minutes which consisted of chart review, discussing chest discomfort and constipation, work up, treatment answering questions and documentation.

## 2022-05-25 ENCOUNTER — Other Ambulatory Visit: Payer: Self-pay | Admitting: Adult Health

## 2022-05-25 ENCOUNTER — Ambulatory Visit (INDEPENDENT_AMBULATORY_CARE_PROVIDER_SITE_OTHER): Payer: BC Managed Care – PPO | Admitting: Adult Health

## 2022-05-25 ENCOUNTER — Encounter: Payer: Self-pay | Admitting: Adult Health

## 2022-05-25 VITALS — BP 110/80 | HR 60 | Temp 97.7°F | Ht 68.5 in | Wt 189.0 lb

## 2022-05-25 DIAGNOSIS — N401 Enlarged prostate with lower urinary tract symptoms: Secondary | ICD-10-CM

## 2022-05-25 DIAGNOSIS — Z23 Encounter for immunization: Secondary | ICD-10-CM | POA: Diagnosis not present

## 2022-05-25 DIAGNOSIS — Z0000 Encounter for general adult medical examination without abnormal findings: Secondary | ICD-10-CM | POA: Diagnosis not present

## 2022-05-25 DIAGNOSIS — Z720 Tobacco use: Secondary | ICD-10-CM | POA: Diagnosis not present

## 2022-05-25 DIAGNOSIS — Z1211 Encounter for screening for malignant neoplasm of colon: Secondary | ICD-10-CM

## 2022-05-25 DIAGNOSIS — R351 Nocturia: Secondary | ICD-10-CM | POA: Diagnosis not present

## 2022-05-25 LAB — COMPREHENSIVE METABOLIC PANEL
ALT: 19 U/L (ref 0–53)
AST: 17 U/L (ref 0–37)
Albumin: 4.5 g/dL (ref 3.5–5.2)
Alkaline Phosphatase: 78 U/L (ref 39–117)
BUN: 8 mg/dL (ref 6–23)
CO2: 28 mEq/L (ref 19–32)
Calcium: 9.2 mg/dL (ref 8.4–10.5)
Chloride: 103 mEq/L (ref 96–112)
Creatinine, Ser: 0.95 mg/dL (ref 0.40–1.50)
GFR: 89.98 mL/min (ref >60.00)
Glucose, Bld: 98 mg/dL (ref 70–99)
Potassium: 4.4 mEq/L (ref 3.5–5.1)
Sodium: 139 mEq/L (ref 135–145)
Total Bilirubin: 0.9 mg/dL (ref 0.2–1.2)
Total Protein: 7.4 g/dL (ref 6.0–8.3)

## 2022-05-25 LAB — CBC WITH DIFFERENTIAL/PLATELET
Basophils Absolute: 0 10*3/uL (ref 0.0–0.1)
Basophils Relative: 0.5 % (ref 0.0–3.0)
Eosinophils Absolute: 0.4 10*3/uL (ref 0.0–0.7)
Eosinophils Relative: 7.1 % — ABNORMAL HIGH (ref 0.0–5.0)
HCT: 40.5 % (ref 39.0–52.0)
Hemoglobin: 14.2 g/dL (ref 13.0–17.0)
Lymphocytes Relative: 41.8 % (ref 12.0–46.0)
Lymphs Abs: 2.2 10*3/uL (ref 0.7–4.0)
MCHC: 35.1 g/dL (ref 30.0–36.0)
MCV: 94.5 fl (ref 78.0–100.0)
Monocytes Absolute: 0.4 10*3/uL (ref 0.1–1.0)
Monocytes Relative: 6.7 % (ref 3.0–12.0)
Neutro Abs: 2.3 10*3/uL (ref 1.4–7.7)
Neutrophils Relative %: 43.9 % (ref 43.0–77.0)
Platelets: 174 10*3/uL (ref 150.0–400.0)
RBC: 4.29 Mil/uL (ref 4.22–5.81)
RDW: 12.8 % (ref 11.5–15.5)
WBC: 5.4 10*3/uL (ref 4.0–10.5)

## 2022-05-25 LAB — LIPID PANEL
Cholesterol: 139 mg/dL (ref 0–200)
HDL: 33.2 mg/dL — ABNORMAL LOW (ref >39.00)
LDL Cholesterol: 68 mg/dL (ref 0–99)
NonHDL: 106.26
Total CHOL/HDL Ratio: 4
Triglycerides: 192 mg/dL — ABNORMAL HIGH (ref 0.0–149.0)
VLDL: 38.4 mg/dL (ref 0.0–40.0)

## 2022-05-25 LAB — URINALYSIS
Bilirubin Urine: NEGATIVE
Hgb urine dipstick: NEGATIVE
Ketones, ur: NEGATIVE
Leukocytes,Ua: NEGATIVE
Nitrite: NEGATIVE
Specific Gravity, Urine: 1.015 (ref 1.000–1.030)
Total Protein, Urine: NEGATIVE
Urine Glucose: NEGATIVE
Urobilinogen, UA: 0.2 (ref 0.0–1.0)
pH: 6.5 (ref 5.0–8.0)

## 2022-05-25 LAB — HEMOGLOBIN A1C: Hgb A1c MFr Bld: 5.5 % (ref 4.6–6.5)

## 2022-05-25 LAB — PSA: PSA: 1.45 ng/mL (ref 0.10–4.00)

## 2022-05-25 MED ORDER — VARENICLINE TARTRATE 0.5 MG PO TABS: 0.5000 mg | ORAL_TABLET | Freq: Two times a day (BID) | ORAL | 0 refills | Status: AC

## 2022-05-25 NOTE — Patient Instructions (Addendum)
It was great seeing you today   We will follow up with you regarding your lab work   Please let me know if you need anything   Please call [facility] and schedule your colonoscopy  Phone: [phone]   Send me a mychart message when you are ready for a refill of Chantix

## 2022-05-25 NOTE — Progress Notes (Signed)
Subjective:    Patient ID: Parker Nguyen, male    DOB: 03-06-1966, 57 y.o.   MRN: [id number]  HPI Patient presents for yearly preventative medicine examination. He is a pleasant 57 year old male who  has a past medical history of Alcohol use, Allergy to meat, and Anaphylaxis.   Tobacco Use - He continues to smoke, reports a pack lasts him 4 days. He only smokes when he is at work. Does not smoke on the weekend. He is wanting to use Chantix again   Colon Cancer Screening - he is over for five year follow up   BPH - has noticed that over the year he has been getting up more at night to urinate, had decreased stream and doesn't always feel like he is emptying his bladder. Denies dyuria or hematuria    All immunizations and health maintenance protocols were reviewed with the patient and needed orders were placed.  Appropriate screening laboratory values were ordered for the patient including screening of hyperlipidemia, renal function and hepatic function. If indicated by BPH, a PSA was ordered.  Medication reconciliation,  past medical history, social history, problem list and allergies were reviewed in detail with the patient  Goals were established with regard to weight loss, exercise, and  diet in compliance with medications. He continues to go to the gym and workout multiple times a week and eats healthy.  Wt Readings from Last 3 Encounters:  05/25/22 189 lb (85.7 kg)  01/17/22 184 lb (83.5 kg)  05/24/21 183 lb (83 kg)    Review of Systems  Constitutional: Negative.   HENT: Negative.    Eyes: Negative.   Respiratory: Negative.    Cardiovascular: Negative.   Gastrointestinal: Negative.   Endocrine: Negative.   Genitourinary:  Positive for decreased urine volume and difficulty urinating.  Musculoskeletal: Negative.   Skin: Negative.   Allergic/Immunologic: Negative.   Neurological: Negative.   Hematological: Negative.   Psychiatric/Behavioral: Negative.    All  other systems reviewed and are negative.  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 ,2018    Social History   Socioeconomic History   Marital status: Married    Spouse name: Not on file   Number of children: 6   Years of education: Not on file   Highest education level: Not on file  Occupational History   Occupation: shipping/receiving  Tobacco Use   Smoking status: Light Smoker    Packs/day: 0.25    Years: 37.00    Total pack years: 9.25    Types: Cigarettes   Smokeless tobacco: Never   Tobacco comments:    pack cigs last about 1 week per pt  Vaping Use   Vaping Use: Never used  Substance and Sexual Activity   Alcohol use: No    Alcohol/week: 3.0 - 5.0 standard drinks of alcohol    Types: 3 - 5 Cans of beer per week    Comment: quit 07/24/16   Drug use: No   Sexual activity: Not on file  Other Topics Concern   Not on file  Social History Narrative   Works in Scientist, research (life sciences) and Receiving    Social Determinants of Health   Financial Resource Strain: Not on file  Food Insecurity: Not on file  Transportation Needs: Not on file  Physical Activity: Not on file  Stress: Not on file  Social Connections: Not  on file  Intimate Partner Violence: Not on file    Past Surgical History:  Procedure Laterality Date   ESOPHAGOGASTRODUODENOSCOPY (EGD) WITH PROPOFOL N/A 07/25/2016   Procedure: ESOPHAGOGASTRODUODENOSCOPY (EGD) WITH PROPOFOL;  Surgeon: Mauri Pole, MD;  Location: [facility];  Service: Endoscopy;  Laterality: N/A;   FLEXIBLE SIGMOIDOSCOPY N/A 07/25/2016   Procedure: FLEXIBLE SIGMOIDOSCOPY;  Surgeon: Mauri Pole, MD;  Location: [facility];  Service: Endoscopy;  Laterality: N/A;   Left arm fracture  1989   SIGMOIDOSCOPY     UPPER GASTROINTESTINAL ENDOSCOPY     WISDOM TOOTH EXTRACTION      Family History  Problem Relation Age of Onset   Cancer Mother        Unsure type   Hypertension  Mother    Diabetes Mother    Congestive Heart Failure Mother    Heart disease Father    Kidney disease Father    Diabetes Father    Colon polyps Father    Colon cancer Neg Hx    Esophageal cancer Neg Hx    Prostate cancer Neg Hx    Rectal cancer Neg Hx    Stomach cancer Neg Hx     Allergies  Allergen Reactions   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     cetirizine (ZYRTEC) 10 MG tablet TAKE 1 TABLET BY MOUTH EVERY DAY 90 tablet 3   Multiple Vitamins-Minerals (CENTRUM SILVER 50+MEN PO) Take 1 tablet by mouth daily.     Omega-3 Fatty Acids (FISH OIL) 1000 MG CAPS Take 1 capsule by mouth daily.     No current facility-administered medications on file prior to visit.    BP 110/80   Pulse 60   Temp 97.7 F (36.5 C) (Oral)   Ht 5' 8.5" (1.74 m)   Wt 189 lb (85.7 kg)   SpO2 99%   BMI 28.32 kg/m       Objective:   Physical Exam Vitals and nursing note reviewed.  Constitutional:      General: He is not in acute distress.    Appearance: Normal appearance. He is not ill-appearing.  HENT:     Head: Normocephalic and atraumatic.     Right Ear: Tympanic membrane, ear canal and external ear normal. There is no impacted cerumen.     Left Ear: Tympanic membrane, ear canal and external ear normal. There is no impacted cerumen.     Nose: Nose normal. No congestion or rhinorrhea.     Mouth/Throat:     Mouth: Mucous membranes are moist.     Pharynx: Oropharynx is clear.  Eyes:     Extraocular Movements: Extraocular movements intact.     Conjunctiva/sclera: Conjunctivae normal.     Pupils: Pupils are equal, round, and reactive to light.  Neck:     Vascular: No carotid bruit.  Cardiovascular:     Rate and Rhythm: Normal rate and regular rhythm.     Pulses: Normal pulses.     Heart sounds: No murmur heard.    No friction rub. No gallop.   Pulmonary:     Effort: Pulmonary effort is normal.     Breath sounds: Normal breath sounds.  Abdominal:     General: Abdomen is flat. Bowel sounds are normal. There is no distension.     Palpations: Abdomen is soft. There is no mass.  Tenderness: There is no abdominal tenderness. There is no guarding or rebound.     Hernia: No hernia is present.  Musculoskeletal:        General: Normal range of motion.     Cervical back: Normal range of motion and neck supple.  Lymphadenopathy:     Cervical: No cervical adenopathy.  Skin:    General: Skin is warm and dry.     Capillary Refill: Capillary refill takes less than 2 seconds.  Neurological:     General: No focal deficit present.     Mental Status: He is alert and oriented to person, place, and time.  Psychiatric:        Mood and Affect: Mood normal.        Behavior: Behavior normal.        Thought Content: Thought content normal.        Judgment: Judgment normal.       Assessment & Plan:  1. Routine general medical examination at a health care facility - Encouraged to quit smoking  - Follow up in one year or sooner if needed  - CBC with Differential/Platelet; Future - Comprehensive metabolic panel; Future - Lipid panel; Future - Hemoglobin A1c; Future  2. Tobacco use  - CBC with Differential/Platelet; Future - Comprehensive metabolic panel; Future - Lipid panel; Future - Hemoglobin A1c; Future - varenicline (CHANTIX) 0.5 MG tablet; Take 1 tablet (0.5 mg total) by mouth 2 (two) times daily.  Dispense: 60 tablet; Refill: 0 - He will let me know when he is ready for the continuation dose   3. Colon cancer screening - Phone number given to call and schedule colonoscopy   4. Benign prostatic hyperplasia with nocturia - Does not want medication at this time  - PSA; Future - Urinalysis; Future   [name], NP

## 2022-06-28 ENCOUNTER — Telehealth: Payer: Self-pay | Admitting: Adult Health

## 2022-06-28 NOTE — Telephone Encounter (Signed)
Pt called to FU on colonoscopy referral, as discussed during CPE on 05/25/22.  Please advise.

## 2022-06-29 ENCOUNTER — Telehealth (INDEPENDENT_AMBULATORY_CARE_PROVIDER_SITE_OTHER): Payer: BC Managed Care – PPO | Admitting: Adult Health

## 2022-06-29 ENCOUNTER — Encounter: Payer: Self-pay | Admitting: Adult Health

## 2022-06-29 VITALS — Ht 68.5 in | Wt 189.0 lb

## 2022-06-29 DIAGNOSIS — R898 Other abnormal findings in specimens from other organs, systems and tissues: Secondary | ICD-10-CM

## 2022-06-29 NOTE — Telephone Encounter (Signed)
Pt notified that phone number was given so that he could call and schedule. Pt given phone number again. No further actions needed.

## 2022-06-29 NOTE — Progress Notes (Signed)
Virtual Visit via Video Note  I connected with Parker Nguyen on 06/29/22 at  4:00 PM EST by a video enabled telemedicine application and verified that I am speaking with the correct person using two identifiers.  Location patient: home Location provider:work or home office Persons participating in the virtual visit: patient, provider  I discussed the limitations of evaluation and management by telemedicine and the availability of in person appointments. The patient expressed understanding and agreed to proceed.   HPI:  57 year old male who  has a past medical history of Alcohol use, Allergy to meat, and Anaphylaxis.  He is concerned that his most recent blood work showed that his Eosinophils was slightly elevated at 7.1. All the rest of his blood work was normal.      ROS: See pertinent positives and negatives per HPI.  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 ,2018    Past Surgical History:  Procedure Laterality Date   ESOPHAGOGASTRODUODENOSCOPY (EGD) WITH PROPOFOL N/A 07/25/2016   Procedure: ESOPHAGOGASTRODUODENOSCOPY (EGD) WITH PROPOFOL;  Surgeon: Mauri Pole, MD;  Location: [facility];  Service: Endoscopy;  Laterality: N/A;   FLEXIBLE SIGMOIDOSCOPY N/A 07/25/2016   Procedure: FLEXIBLE SIGMOIDOSCOPY;  Surgeon: Mauri Pole, MD;  Location: [facility];  Service: Endoscopy;  Laterality: N/A;   Left arm fracture  1989   SIGMOIDOSCOPY     UPPER GASTROINTESTINAL ENDOSCOPY     WISDOM TOOTH EXTRACTION      Family History  Problem Relation Age of Onset   Cancer Mother        Unsure type   Hypertension Mother    Diabetes Mother    Congestive Heart Failure Mother    Heart disease Father    Kidney disease Father    Diabetes Father    Colon polyps Father    Colon cancer Neg Hx    Esophageal cancer Neg Hx    Prostate cancer Neg Hx    Rectal cancer Neg Hx    Stomach cancer Neg Hx         Current Outpatient Medications:    Capsicum, Cayenne, (CAYENNE PEPPER PO), Take by mouth., Disp: , Rfl:    cetirizine (ZYRTEC) 10 MG tablet, TAKE 1 TABLET BY MOUTH EVERY DAY, Disp: 90 tablet, Rfl: 3   Multiple Vitamins-Minerals (CENTRUM SILVER 50+MEN PO), Take 1 tablet by mouth daily., Disp: , Rfl:    Omega-3 Fatty Acids (FISH OIL) 1000 MG CAPS, Take 1 capsule by mouth daily., Disp: , Rfl:    diphenhydrAMINE (BENADRYL ALLERGY) 25 MG tablet, 1 tablet as needed Orally every 8 hrs, Disp: , Rfl:    EPINEPHrine (AUVI-Q) 0.3 mg/0.3 mL IJ SOAJ injection, as directed Injection prn for systemic reactions for 30 days, Disp: , Rfl:   EXAM:  VITALS per patient if applicable:  GENERAL: alert, oriented, appears well and in no acute distress  HEENT: atraumatic, conjunttiva clear, no obvious abnormalities on inspection of external nose and ears  NECK: normal movements of the head and neck  LUNGS: on inspection no signs of respiratory distress, breathing rate appears normal, no obvious gross SOB, gasping or wheezing  CV: no obvious cyanosis  MS: moves all visible extremities without noticeable abnormality  PSYCH/NEURO: pleasant and cooperative, no obvious depression or anxiety, speech and thought processing grossly intact  ASSESSMENT AND PLAN:  Discussed the following assessment and plan:  1. Eosinophils increased -  Advised him that this is likely caused from seasonal allergies and that the level he was at was not significant        I discussed the assessment and treatment plan with the patient. The patient was provided an opportunity to ask questions and all were answered. The patient agreed with the plan and demonstrated an understanding of the instructions.   The patient was advised to call back or seek an in-person evaluation if the symptoms worsen or if the condition fails to improve as anticipated.   [name], NP

## 2022-07-12 ENCOUNTER — Telehealth: Payer: Self-pay

## 2022-07-12 NOTE — Telephone Encounter (Signed)
Multiple attempts made to reach patient by phone; Unable to speak with patient; Message left on voicemail; RN able to leave a message for patient to call back to the office prior to 5 pm; patient given number to office on voicemail;  If patient fails to call back prior to 5pm or RN unable to reach patient prior to 5 pm, PV and procedure appts will be cancelled and a no show letter will be sent via MyChart to patient;

## 2022-07-30 ENCOUNTER — Telehealth: Payer: Self-pay | Admitting: *Deleted

## 2022-07-30 NOTE — Telephone Encounter (Signed)
Made pt. Aware of recall recommendations of colonoscopy for 09/2023,pt.stated "my job require [place] to have physical yearly,pt.is going to check with job to see if colonoscopy required and also   contact insurance company to see if procedure would be [employer] does not cover he will cancel procedure and  schedule procedure when covered.

## 2022-08-13 ENCOUNTER — Ambulatory Visit (AMBULATORY_SURGERY_CENTER): Payer: BC Managed Care – PPO | Admitting: *Deleted

## 2022-08-13 ENCOUNTER — Encounter: Payer: BC Managed Care – PPO | Admitting: Gastroenterology

## 2022-08-13 VITALS — Ht 69.0 in | Wt 180.0 lb

## 2022-08-13 DIAGNOSIS — Z86010 Personal history of colonic polyps: Secondary | ICD-10-CM

## 2022-08-13 DIAGNOSIS — Z789 Other specified health status: Secondary | ICD-10-CM | POA: Insufficient documentation

## 2022-08-13 MED ORDER — NA SULFATE-K SULFATE-MG SULF 17.5-3.13-1.6 GM/177ML PO SOLN
1.0000 | Freq: Once | ORAL | 0 refills | Status: AC
Start: 2022-08-13 — End: 2022-08-13

## 2022-08-13 NOTE — Progress Notes (Signed)

## 2022-08-27 ENCOUNTER — Encounter: Payer: Self-pay | Admitting: Gastroenterology

## 2022-09-04 ENCOUNTER — Encounter: Payer: Self-pay | Admitting: Gastroenterology

## 2022-09-04 ENCOUNTER — Ambulatory Visit (AMBULATORY_SURGERY_CENTER): Payer: BC Managed Care – PPO | Admitting: Gastroenterology

## 2022-09-04 VITALS — BP 124/79 | HR 66 | Temp 97.5°F | Resp 11 | Ht 69.0 in | Wt 180.0 lb

## 2022-09-04 DIAGNOSIS — D123 Benign neoplasm of transverse colon: Secondary | ICD-10-CM

## 2022-09-04 DIAGNOSIS — Z09 Encounter for follow-up examination after completed treatment for conditions other than malignant neoplasm: Secondary | ICD-10-CM

## 2022-09-04 DIAGNOSIS — D122 Benign neoplasm of ascending colon: Secondary | ICD-10-CM

## 2022-09-04 DIAGNOSIS — Z86010 Personal history of colonic polyps: Secondary | ICD-10-CM | POA: Diagnosis not present

## 2022-09-04 DIAGNOSIS — Z1211 Encounter for screening for malignant neoplasm of colon: Secondary | ICD-10-CM | POA: Diagnosis not present

## 2022-09-04 MED ORDER — SODIUM CHLORIDE 0.9 % IV SOLN: 500.0000 mL | Freq: Once | INTRAVENOUS | Status: DC

## 2022-09-04 NOTE — Progress Notes (Signed)
Pt's states no medical or surgical changes since previsit or office visit. 

## 2022-09-04 NOTE — Progress Notes (Signed)
Called to room to assist during endoscopic procedure.  Patient ID and intended procedure confirmed with present staff. Received instructions for my participation in the procedure from the performing physician.  

## 2022-09-04 NOTE — Patient Instructions (Addendum)
Resume previous diet Continue present medications Await pathology results  Handouts/information given for polyps, diverticulosis and hemorrhoids  YOU HAD AN ENDOSCOPIC PROCEDURE TODAY AT THE [facility]:   Refer to the procedure report that was given to you for any specific questions about what was found during the examination.  If the procedure report does not answer your questions, please call your gastroenterologist to clarify.  If you requested that your care partner not be given the details of your procedure findings, then the procedure report has been included in a sealed envelope for you to review at your convenience later.  YOU SHOULD EXPECT: Some feelings of bloating in the abdomen. Passage of more gas than usual.  Walking can help get rid of the air that was put into your GI tract during the procedure and reduce the bloating. If you had a lower endoscopy (such as a colonoscopy or flexible sigmoidoscopy) you may notice spotting of blood in your stool or on the toilet paper. If you underwent a bowel prep for your procedure, you may not have a normal bowel movement for a few days.  Please Note:  You might notice some irritation and congestion in your nose or some drainage.  This is from the oxygen used during your procedure.  There is no need for concern and it should clear up in a day or so.  SYMPTOMS TO REPORT IMMEDIATELY:  Following lower endoscopy (colonoscopy):  Excessive amounts of blood in the stool  Significant tenderness or worsening of abdominal pains  Swelling of the abdomen that is new, acute  Fever of 100F or higher  For urgent or emergent issues, a gastroenterologist can be reached at any hour by calling [phone]. Do not use MyChart messaging for urgent concerns.   DIET:  We do recommend a small meal at first, but then you may proceed to your regular diet.  Drink plenty of fluids but you should avoid alcoholic beverages for 24 hours.  ACTIVITY:  You  should plan to take it easy for the rest of today and you should NOT DRIVE or use heavy machinery until tomorrow (because of the sedation medicines used during the test).    FOLLOW UP: Our staff will call the number listed on your records the next business day following your procedure.  We will call around 7:15- 8:00 am to check on you and address any questions or concerns that you may have regarding the information given to you following your procedure. If we do not reach you, we will leave a message.     If any biopsies were taken you will be contacted by phone or by letter within the next 1-3 weeks.  Please call us at [phone] if you have not heard about the biopsies in 3 weeks.    SIGNATURES/CONFIDENTIALITY: You and/or your care partner have signed paperwork which will be entered into your electronic medical record.  These signatures attest to the fact that that the information above on your After Visit Summary has been reviewed and is understood.  Full responsibility of the confidentiality of this discharge information lies with you and/or your care-partner. 

## 2022-09-04 NOTE — Progress Notes (Signed)
A and O x3. Report to RN. Tolerated MAC anesthesia well. 

## 2022-09-04 NOTE — Progress Notes (Signed)
Gastroenterology History and Physical   Primary Care Physician:  Shirline Frees, NP   Reason for Procedure:  History of adenomatous colon polyps  Plan:    Surveillance colonoscopy with possible interventions as needed     HPI: Parker Nguyen is a very pleasant 57 y.o. male here for surveillance colonoscopy. Denies any nausea, vomiting, abdominal pain, melena or bright red blood per rectum  The risks and benefits as well as alternatives of endoscopic procedure(s) have been discussed and reviewed. All questions answered. The patient agrees to proceed.    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 ,2018   Hyperlipidemia    Substance abuse (HCC)    ETOH    Past Surgical History:  Procedure Laterality Date   COLONOSCOPY     ESOPHAGOGASTRODUODENOSCOPY (EGD) WITH PROPOFOL N/A 07/25/2016   Procedure: ESOPHAGOGASTRODUODENOSCOPY (EGD) WITH PROPOFOL;  Surgeon: Napoleon Form, MD;  Location: [facility];  Service: Endoscopy;  Laterality: N/A;   FLEXIBLE SIGMOIDOSCOPY N/A 07/25/2016   Procedure: FLEXIBLE SIGMOIDOSCOPY;  Surgeon: Napoleon Form, MD;  Location: [facility];  Service: Endoscopy;  Laterality: N/A;   Left arm fracture  1989   SIGMOIDOSCOPY     UPPER GASTROINTESTINAL ENDOSCOPY     WISDOM TOOTH EXTRACTION      Prior to Admission medications   Medication Sig Start Date End Date Taking? Authorizing Provider  cetirizine (ZYRTEC) 10 MG tablet TAKE 1 TABLET BY MOUTH EVERY DAY 05/25/22  Yes Nafziger, Kandee Keen, NP  Multiple Vitamins-Minerals (CENTRUM SILVER 50+MEN PO) Take 1 tablet by mouth daily.   Yes [provider]  Omega-3 Fatty Acids (FISH OIL) 1000 MG CAPS Take 1 capsule by mouth daily.   Yes [provider]  Turmeric (QC TUMERIC COMPLEX) 500 MG CAPS Take by mouth daily.   Yes [provider]  diphenhydrAMINE (BENADRYL ALLERGY) 25 MG tablet 1 tablet as  needed Orally every 8 hrs Patient not taking: Reported on 08/13/2022    [provider]  EPINEPHrine (AUVI-Q) 0.3 mg/0.3 mL IJ SOAJ injection as directed Injection prn for systemic reactions for 30 days Patient not taking: Reported on 08/13/2022    [provider]    Current Outpatient Medications  Medication Sig Dispense Refill   cetirizine (ZYRTEC) 10 MG tablet TAKE 1 TABLET BY MOUTH EVERY DAY 90 tablet 3   Multiple Vitamins-Minerals (CENTRUM SILVER 50+MEN PO) Take 1 tablet by mouth daily.     Omega-3 Fatty Acids (FISH OIL) 1000 MG CAPS Take 1 capsule by mouth daily.     Turmeric (QC TUMERIC COMPLEX) 500 MG CAPS Take by mouth daily.     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    Family History  Problem Relation Age of Onset   Cancer Mother  Unsure type   Hypertension Mother    Diabetes Mother    Congestive Heart Failure Mother    Heart disease Father    Kidney disease Father    Diabetes Father    Colon polyps Father    Colon cancer Neg Hx    Esophageal cancer Neg Hx    Prostate cancer Neg Hx    Rectal cancer Neg Hx    Stomach cancer Neg Hx    Crohn's disease Neg Hx    Ulcerative colitis Neg Hx     Social History   Socioeconomic History   Marital status: Married    Spouse name: Not on file   Number of children: 6   Years of education: Not on file   Highest education level: Not on  file  Occupational History   Occupation: shipping/receiving  Tobacco Use   Smoking status: Light Smoker    Packs/day: 0.25    Years: 37.00    Additional pack years: 0.00    Total pack years: 9.25    Types: Cigarettes   Smokeless tobacco: Never   Tobacco comments:    pack cigs last about 1 week per pt  Vaping Use   Vaping Use: Never used  Substance and Sexual Activity   Alcohol use: Yes    Alcohol/week: 3.0 - 5.0 standard drinks of alcohol    Types: 3 - 5 Cans of beer per week    Comment: BEER   Drug use: No   Sexual activity: Not on file  Other Topics Concern   Not on file  Social History Narrative   Works in Teaching laboratory technician and Receiving    Social Determinants of Health   Financial Resource Strain: Not on file  Food Insecurity: Not on file  Transportation Needs: Not on file  Physical Activity: Not on file  Stress: Not on file  Social Connections: Not on file  Intimate Partner Violence: Not on file    Review of Systems:  All other review of systems negative except as mentioned in the HPI.  Physical Exam: Vital signs in last 24 hours: Blood Pressure 131/74   Pulse 63   Temperature (Abnormal) 97.5 F (36.4 C)   Height 5\' 9"  (1.753 m)   Weight 180 lb (81.6 kg)   Oxygen Saturation 96%   Body Mass Index 26.58 kg/m  General:   Alert, NAD Lungs:  Clear .   Heart:  Regular rate and rhythm Abdomen:  Soft, nontender and nondistended. Neuro/Psych:  Alert and cooperative. Normal mood and affect. A and O x 3  Reviewed labs, radiology imaging, old records and pertinent past GI work up  Patient is appropriate for planned procedure(s) and anesthesia in an ambulatory setting   K. Scherry Ran , MD [phone]

## 2022-09-04 NOTE — Op Note (Signed)
[facility] Patient Name: Parker Nguyen Procedure Date: 09/04/2022 9:52 AM MRN: [id number] Endoscopist: Napoleon Form , MD, [id number] Age: 57 Referring MD:  Date of Birth: 03-03-1966 Gender: Male Account #: [id number] Procedure:                Colonoscopy Indications:              High risk colon cancer surveillance: Personal                            history of colonic polyps, High risk colon cancer                            surveillance: Personal history of adenoma less than                            10 mm in size Medicines:                Monitored Anesthesia Care Procedure:                Pre-Anesthesia Assessment:                           - Prior to the procedure, a History and Physical                            was performed, and patient medications and                            allergies were reviewed. The patient's tolerance of                            previous anesthesia was also reviewed. The risks                            and benefits of the procedure and the sedation                            options and risks were discussed with the patient.                            All questions were answered, and informed consent                            was obtained. Prior Anticoagulants: The patient has                            taken no anticoagulant or antiplatelet agents. ASA                            Grade Assessment: II - A patient with mild systemic                            disease. After reviewing the risks and benefits,  the patient was deemed in satisfactory condition to                            undergo the procedure.                           After obtaining informed consent, the colonoscope                            was passed under direct vision. Throughout the                            procedure, the patient's blood pressure, pulse, and                            oxygen saturations were monitored  continuously. The                            Olympus PCF-H190DL ([id number]) Colonoscope was                            introduced through the anus and advanced to the the                            cecum, identified by appendiceal orifice and                            ileocecal valve. The colonoscopy was performed                            without difficulty. The patient tolerated the                            procedure well. The quality of the bowel                            preparation was good. The ileocecal valve,                            appendiceal orifice, and rectum were photographed. Scope In: 10:07:02 AM Scope Out: 10:20:05 AM Scope Withdrawal Time: 0 hours 10 minutes 26 seconds  Total Procedure Duration: 0 hours 13 minutes 3 seconds  Findings:                 The perianal and digital rectal examinations were                            normal.                           Four sessile polyps were found in the transverse                            colon and ascending colon. The polyps were 4 to 7  mm in size. These polyps were removed with a cold                            snare. Resection and retrieval were complete.                           A few small-mouthed diverticula were found in the                            sigmoid colon.                           Non-bleeding external and internal hemorrhoids were                            found during retroflexion. The hemorrhoids were                            small. Complications:            No immediate complications. Estimated Blood Loss:     Estimated blood loss was minimal. Impression:               - Four 4 to 7 mm polyps in the transverse colon and                            in the ascending colon, removed with a cold snare.                            Resected and retrieved.                           - Diverticulosis in the sigmoid colon.                           - Non-bleeding external and  internal hemorrhoids. Recommendation:           - Patient has a contact number available for                            emergencies. The signs and symptoms of potential                            delayed complications were discussed with the                            patient. Return to normal activities tomorrow.                            Written discharge instructions were provided to the                            patient.                           - Resume previous diet.                           -  Continue present medications.                           - Await pathology results.                           - Repeat colonoscopy in 3 - 5 years for                            surveillance based on pathology results. Napoleon Form, MD 09/04/2022 10:35:31 AM This report has been signed electronically.

## 2022-09-05 ENCOUNTER — Telehealth: Payer: Self-pay

## 2022-09-05 NOTE — Telephone Encounter (Signed)
  Follow up Call-     09/04/2022    9:17 AM  Call back number  Post procedure Call Back phone  # [phone]  Permission to leave phone message Yes     Patient questions:  Do you have a fever, pain , or abdominal swelling? No. Pain Score  0 *  Have you tolerated food without any problems? Yes.    Have you been able to return to your normal activities? Yes.    Do you have any questions about your discharge instructions: Diet   No. Medications  No. Follow up visit  No.  Do you have questions or concerns about your Care? No.  Actions: * If pain score is 4 or above: No action needed, pain <4.

## 2022-09-18 ENCOUNTER — Encounter: Payer: Self-pay | Admitting: Gastroenterology

## 2023-04-10 IMAGING — DX DG FOOT COMPLETE 3+V*L*
2 series · 3 of 3 positions shown · non-contrast
Comparison: None.

CLINICAL DATA: Left foot injury, pain

EXAM:
LEFT FOOT - COMPLETE 3+ VIEW

[Series 1: foot · 0.14mm/px · 2 of 2 slices shown]
[im 1/2]
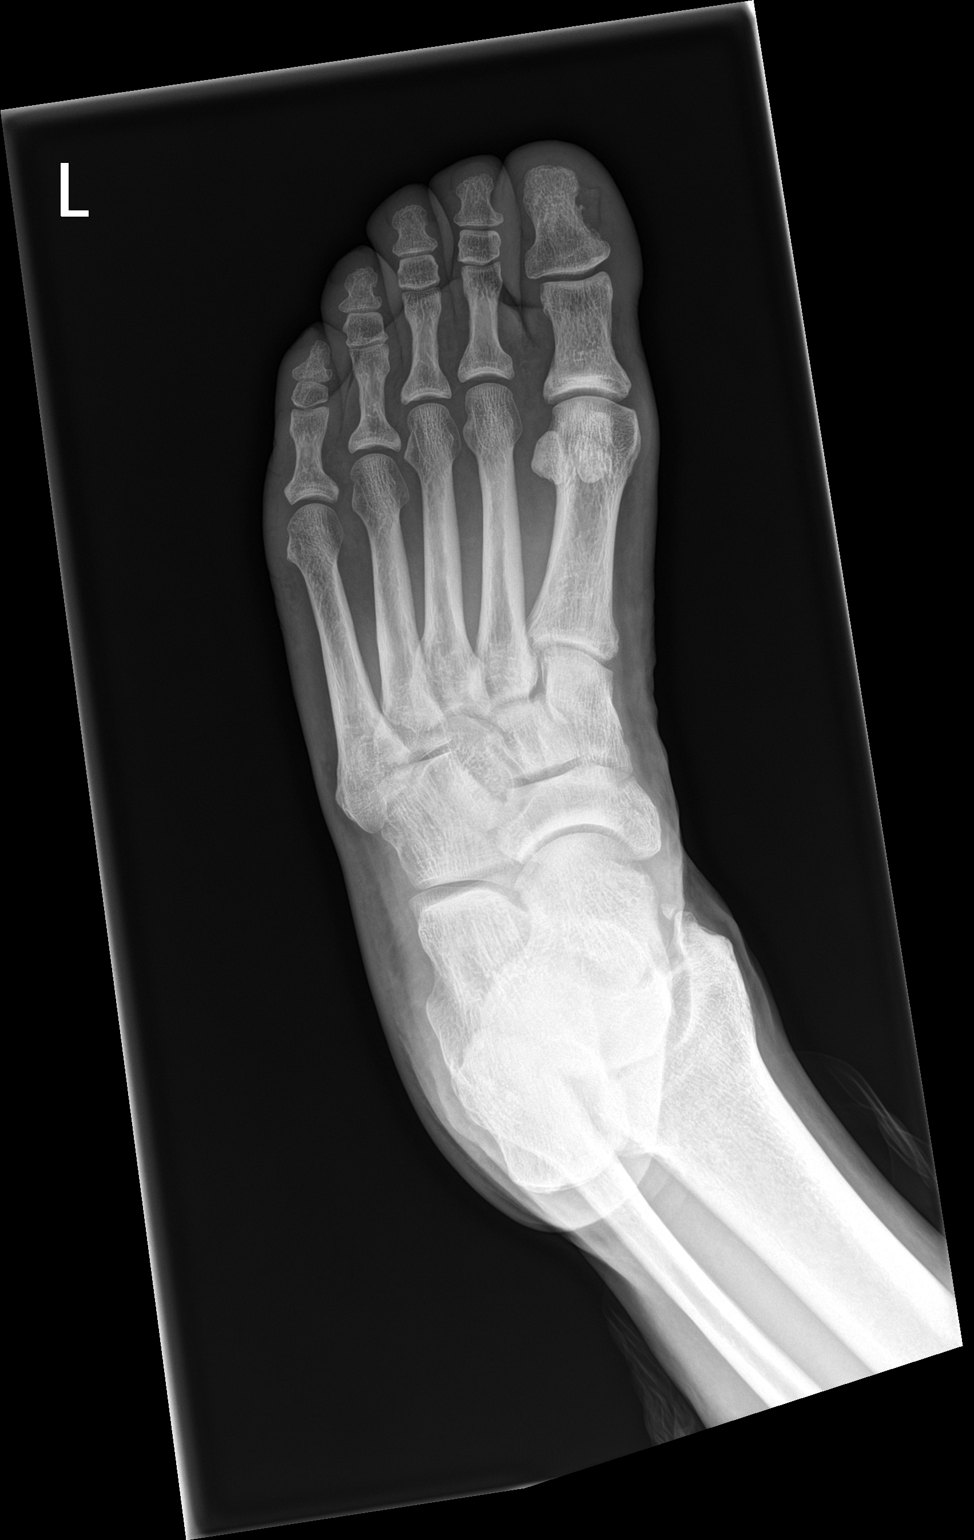
[im 2/2]
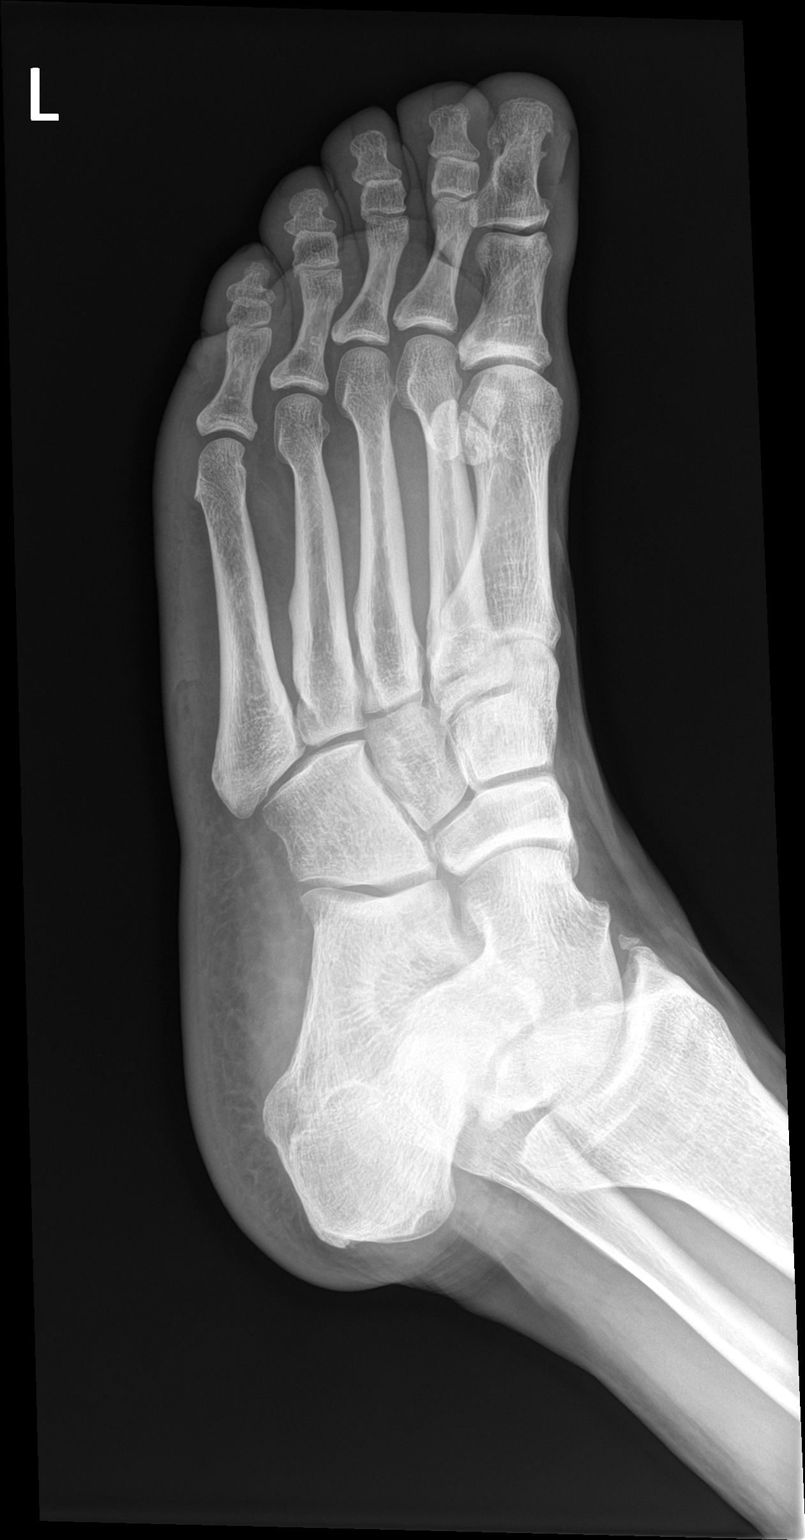

[leg]
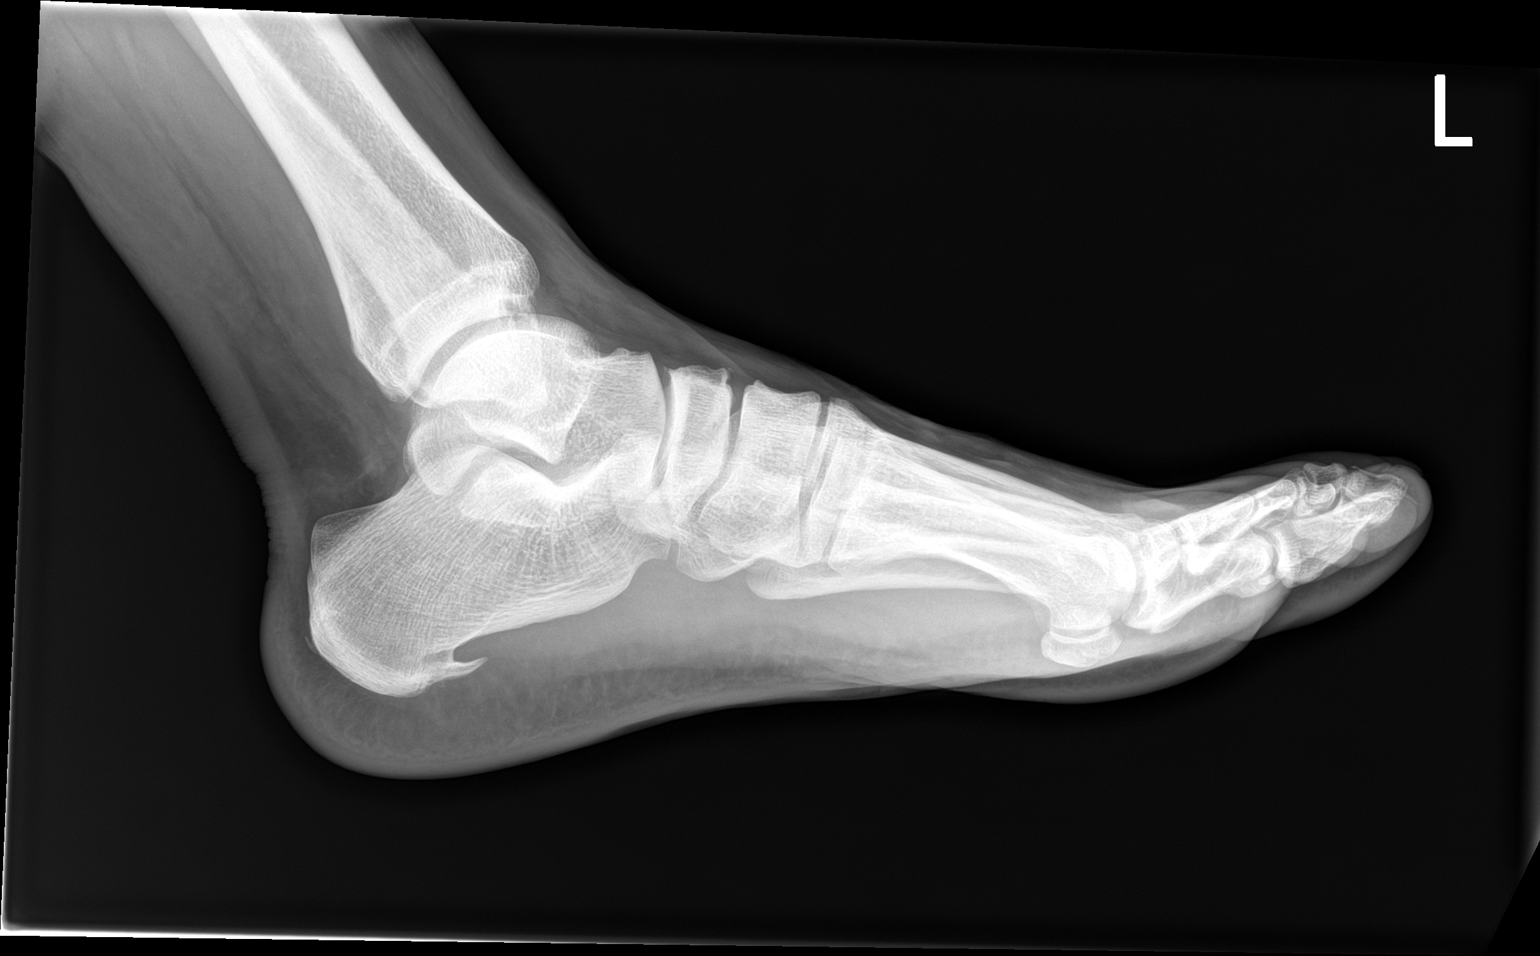

[3 of 3 positions shown; findings below may reference images not displayed]

FINDINGS: Plantar calcaneal spur. No acute bony abnormality. Specifically, no
fracture, subluxation, or dislocation. Joint spaces maintained. Soft
tissues are intact.
IMPRESSION: No acute bony abnormality.

## 2023-05-22 ENCOUNTER — Encounter: Payer: Self-pay | Admitting: Podiatry

## 2023-05-22 ENCOUNTER — Ambulatory Visit: Payer: BC Managed Care – PPO | Admitting: Podiatry

## 2023-05-22 DIAGNOSIS — D2372 Other benign neoplasm of skin of left lower limb, including hip: Secondary | ICD-10-CM | POA: Diagnosis not present

## 2023-05-22 DIAGNOSIS — M2042 Other hammer toe(s) (acquired), left foot: Secondary | ICD-10-CM | POA: Diagnosis not present

## 2023-05-22 NOTE — Progress Notes (Signed)
   Chief Complaint  Patient presents with   Callouses    "I some calluses."    Subjective: 58 y.o. male presenting to the office today for evaluation of pain and tenderness associated to the bilateral feet secondary to skin lesions that develop.  He was last seen in the office over 1 year ago for the same condition.  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 ,2018   Hyperlipidemia    Substance abuse (HCC)    ETOH    Past Surgical History:  Procedure Laterality Date   COLONOSCOPY     ESOPHAGOGASTRODUODENOSCOPY (EGD) WITH PROPOFOL N/A 07/25/2016   Procedure: ESOPHAGOGASTRODUODENOSCOPY (EGD) WITH PROPOFOL;  Surgeon: Napoleon Form, MD;  Location: [facility];  Service: Endoscopy;  Laterality: N/A;   FLEXIBLE SIGMOIDOSCOPY N/A 07/25/2016   Procedure: FLEXIBLE SIGMOIDOSCOPY;  Surgeon: Napoleon Form, MD;  Location: [facility];  Service: Endoscopy;  Laterality: N/A;   Left arm fracture  1989   SIGMOIDOSCOPY     UPPER GASTROINTESTINAL ENDOSCOPY     WISDOM TOOTH EXTRACTION      Allergies  Allergen Reactions   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 Pain on palpation with a central nucleated core noted. Skin is warm, dry and supple bilateral lower extremities. Negative for open lesions or macerations.  Vascular: Palpable pedal pulses bilaterally. No edema or erythema noted. Capillary refill within normal limits.  Neurological: Grossly intact via light touch  Musculoskeletal Exam: Pain on palpation at the keratotic lesion(s) noted. Range of motion within normal limits bilateral. Muscle strength 5/5 in all groups bilateral.  Hammertoe deformity noted to the right fifth digit contributing to the symptomatic skin  lesion  Assessment: 1.  Symptomatic benign skin lesion 2.  Hammertoe right fifth digit  Plan of Care:  -Patient evaluated -Recommend conservative care for the hammertoe deformity.  Specifically wide fitting shoes that do not constrict the toebox area.  Recommend arch supports. -Excisional debridement of keratoic lesion(s) using a chisel blade was performed without incident.  -Salicylic acid applied with a bandaid.  Additional salicylic acid provided to apply daily with a Band-Aid -Return to the clinic PRN.   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 Felecia Shelling, DPM    [address] [place], [place] [address]                Office [phone]  Fax [phone]

## 2023-06-18 ENCOUNTER — Encounter: Payer: Self-pay | Admitting: Adult Health

## 2023-06-18 ENCOUNTER — Ambulatory Visit: Payer: BC Managed Care – PPO | Admitting: Adult Health

## 2023-06-18 VITALS — BP 120/80 | HR 70 | Temp 98.4°F | Ht 69.0 in | Wt 175.0 lb

## 2023-06-18 DIAGNOSIS — N401 Enlarged prostate with lower urinary tract symptoms: Secondary | ICD-10-CM

## 2023-06-18 DIAGNOSIS — R351 Nocturia: Secondary | ICD-10-CM

## 2023-06-18 DIAGNOSIS — Z720 Tobacco use: Secondary | ICD-10-CM | POA: Diagnosis not present

## 2023-06-18 DIAGNOSIS — E782 Mixed hyperlipidemia: Secondary | ICD-10-CM

## 2023-06-18 DIAGNOSIS — B49 Unspecified mycosis: Secondary | ICD-10-CM

## 2023-06-18 DIAGNOSIS — Z0000 Encounter for general adult medical examination without abnormal findings: Secondary | ICD-10-CM

## 2023-06-18 MED ORDER — VARENICLINE TARTRATE 1 MG PO TABS
1.0000 mg | ORAL_TABLET | Freq: Two times a day (BID) | ORAL | 3 refills | Status: AC
Start: 2023-06-18 — End: ?

## 2023-06-18 MED ORDER — TERBINAFINE HCL 250 MG PO TABS: 250.0000 mg | ORAL_TABLET | Freq: Every day | ORAL | 0 refills | Status: AC

## 2023-06-18 MED ORDER — VARENICLINE TARTRATE (STARTER) 0.5 MG X 11 & 1 MG X 42 PO TBPK
ORAL_TABLET | ORAL | 0 refills | Status: AC
Start: 2023-06-18 — End: ?

## 2023-06-18 NOTE — Patient Instructions (Signed)
 It was great seeing you today   We will follow up with you regarding your lab work   Please let me know if you need anything

## 2023-06-18 NOTE — Progress Notes (Signed)
 Subjective:    Patient ID: Parker Nguyen, male    DOB: 1965/05/23, 58 y.o.   MRN: [id number]  HPI Patient presents for yearly preventative medicine examination. He is a pleasant 58 year old male who  has a past medical history of Alcohol use, Allergy to meat, Anaphylaxis, Hyperlipidemia, and Substance abuse (HCC).  Tobacco Use - He continues to smoke, reports a pack lasts him about a week. He only smokes when he is at work. Does not smoke on the weekend. He would like to try Chantix again   BPH - denies symptoms currently.   Fungal Infection - he has a fungal infection on his left hand. He was on lamisil PO in the past for fungal infection on his foot and hands. This took care of it but the fungal infection on his let hand came back.   Hyperlipidemia - not currently on medication  Lab Results  Component Value Date   CHOL 139 05/25/2022   HDL 33.20 (L) 05/25/2022   LDLCALC 68 05/25/2022   LDLDIRECT 68.0 05/12/2020   TRIG 192.0 (H) 05/25/2022   CHOLHDL 4 05/25/2022     All immunizations and health maintenance protocols were reviewed with the patient and needed orders were placed.  Appropriate screening laboratory values were ordered for the patient including screening of hyperlipidemia, renal function and hepatic function. If indicated by BPH, a PSA was ordered.  Medication reconciliation,  past medical history, social history, problem list and allergies were reviewed in detail with the patient  Goals were established with regard to weight loss, exercise, and  diet in compliance with medications. He is going to the gym multiples times a week and eats healthy '  He is up to date on routine colon cancer screening.    Review of Systems  Constitutional: Negative.   HENT: Negative.    Eyes: Negative.   Respiratory: Negative.    Cardiovascular: Negative.   Gastrointestinal: Negative.   Endocrine: Negative.   Genitourinary: Negative.   Musculoskeletal: Negative.    Skin:  Positive for rash.  Allergic/Immunologic: Negative.   Neurological: Negative.   Hematological: Negative.   Psychiatric/Behavioral: Negative.    All other systems reviewed and are negative.  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 ,2018   Hyperlipidemia    Substance abuse (HCC)    ETOH    Social History   Socioeconomic History   Marital status: Married    Spouse name: Not on file   Number of children: 6   Years of education: Not on file   Highest education level: Not on file  Occupational History   Occupation: shipping/receiving  Tobacco Use   Smoking status: Light Smoker    Current packs/day: 0.25    Average packs/day: 0.3 packs/day for 37.0 years (9.3 ttl pk-yrs)    Types: Cigarettes   Smokeless tobacco: Never   Tobacco comments:    pack cigs last about 1 week per pt  Vaping Use   Vaping status: Never Used  Substance and Sexual Activity   Alcohol use: Yes    Alcohol/week: 3.0 - 5.0 standard drinks of alcohol    Types: 3 - 5 Cans of beer per week    Comment: BEER daily   Drug use: No   Sexual activity: Not on file  Other Topics Concern   Not on file  Social History  Narrative   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    Social Drivers of Corporate investment banker Strain: Not on file  Food Insecurity: Not on file  Transportation Needs: Not on file  Physical Activity: Not on file  Stress: Not on file  Social Connections: Not on file  Intimate Partner Violence: Not on file    Past Surgical History:  Procedure Laterality Date   COLONOSCOPY     ESOPHAGOGASTRODUODENOSCOPY (EGD) WITH PROPOFOL N/A 07/25/2016   Procedure: ESOPHAGOGASTRODUODENOSCOPY (EGD) WITH PROPOFOL;  Surgeon: Napoleon Form, MD;  Location: [facility];  Service: Endoscopy;  Laterality: N/A;   FLEXIBLE SIGMOIDOSCOPY N/A 07/25/2016   Procedure: FLEXIBLE SIGMOIDOSCOPY;  Surgeon: Napoleon Form, MD;  Location: [facility];  Service: Endoscopy;  Laterality: N/A;   Left arm fracture  1989   SIGMOIDOSCOPY     UPPER GASTROINTESTINAL ENDOSCOPY     WISDOM TOOTH EXTRACTION      Family History  Problem Relation Age of Onset   Cancer Mother        Unsure type   Hypertension Mother    Diabetes Mother    Congestive Heart Failure Mother    Heart disease Father    Kidney disease Father    Diabetes Father    Colon polyps Father    Colon cancer Neg Hx    Esophageal cancer Neg Hx    Prostate cancer Neg Hx    Rectal cancer Neg Hx    Stomach cancer Neg Hx    Crohn's disease Neg Hx    Ulcerative colitis Neg Hx     Allergies  Allergen Reactions   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     Omega-3 Fatty Acids (FISH OIL) 1000 MG CAPS Take 1 capsule by mouth daily.     Turmeric (QC TUMERIC COMPLEX) 500 MG CAPS Take by mouth daily.     No current facility-administered medications on file prior to visit.    BP 120/80   Pulse 70   Temp 98.4 F (36.9 C) (Oral)   Ht 5\' 9"  (1.753 m)   Wt 175 lb (79.4 kg)   SpO2 97%   BMI 25.84 kg/m       Objective:   Physical Exam Vitals and nursing note reviewed.  Constitutional:      General: He is not in acute distress.    Appearance: Normal appearance. He is not ill-appearing.  HENT:     Head: Normocephalic and atraumatic.     Right Ear: Tympanic membrane, ear canal and external ear normal. There is no impacted cerumen.     Left Ear: Tympanic membrane, ear canal and external ear normal. There is no impacted cerumen.     Nose: Nose normal. No congestion or rhinorrhea.     Mouth/Throat:     Mouth: Mucous membranes are moist.     Pharynx: Oropharynx is  clear.  Eyes:     Extraocular Movements: Extraocular movements intact.     Conjunctiva/sclera: Conjunctivae normal.     Pupils: Pupils are equal, round, and reactive to light.  Neck:     Vascular: No carotid bruit.  Cardiovascular:     Rate  and Rhythm: Normal rate and regular rhythm.     Pulses: Normal pulses.     Heart sounds: No murmur heard.    No friction rub. No gallop.  Pulmonary:     Effort: Pulmonary effort is normal.     Breath sounds: Normal breath sounds.  Abdominal:     General: Abdomen is flat. Bowel sounds are normal. There is no distension.     Palpations: Abdomen is soft. There is no mass.     Tenderness: There is no abdominal tenderness. There is no guarding or rebound.     Hernia: No hernia is present.  Musculoskeletal:        General: Normal range of motion.     Cervical back: Normal range of motion and neck supple.  Lymphadenopathy:     Cervical: No cervical adenopathy.  Skin:    General: Skin is warm and dry.     Capillary Refill: Capillary refill takes less than 2 seconds.     Comments: Dry, rough peeling skin on left hand.   Neurological:     General: No focal deficit present.     Mental Status: He is alert and oriented to person, place, and time.  Psychiatric:        Mood and Affect: Mood normal.        Behavior: Behavior normal.        Thought Content: Thought content normal.        Judgment: Judgment normal.       Assessment & Plan:   1. Routine general medical examination at a health care facility (Primary) Today patient counseled on age appropriate routine health concerns for screening and prevention, each reviewed and up to date or declined. Immunizations reviewed and up to date or declined. Labs ordered and reviewed. Risk factors for depression reviewed and negative. Hearing function and visual acuity are intact. ADLs screened and addressed as needed. Functional ability and level of safety reviewed and appropriate. Education, counseling and  referrals performed based on assessed risks today. Patient provided with a copy of personalized plan for preventive services. - Continue to eat healthy and exercise - Follow up in one year or sooner if needed  2. Tobacco use  - varenicline (CHANTIX CONTINUING MONTH PAK) 1 MG tablet; Take 1 tablet (1 mg total) by mouth 2 (two) times daily.  Dispense: 60 tablet; Refill: 3 - Varenicline Tartrate, Starter, (CHANTIX STARTING MONTH PAK) 0.5 MG X 11 & 1 MG X 42 TBPK; Take as directed  Dispense: 53 each; Refill: 0  3. Benign prostatic hyperplasia with nocturia  - PSA; Future  4. Fungal infection  - terbinafine (LAMISIL) 250 MG tablet; Take 1 tablet (250 mg total) by mouth daily.  Dispense: 30 tablet; Refill: 0  5. Mixed hyperlipidemia - Consider therapy  - Lipid panel; Future - TSH; Future - CBC; Future - Comprehensive metabolic panel; Future   Shirline Frees, NP

## 2023-06-22 ENCOUNTER — Other Ambulatory Visit: Payer: Self-pay | Admitting: Adult Health

## 2023-06-24 ENCOUNTER — Ambulatory Visit: Payer: BC Managed Care – PPO | Admitting: Podiatry

## 2023-07-08 ENCOUNTER — Other Ambulatory Visit (INDEPENDENT_AMBULATORY_CARE_PROVIDER_SITE_OTHER)

## 2023-07-08 DIAGNOSIS — E782 Mixed hyperlipidemia: Secondary | ICD-10-CM | POA: Diagnosis not present

## 2023-07-08 DIAGNOSIS — R351 Nocturia: Secondary | ICD-10-CM

## 2023-07-08 DIAGNOSIS — N401 Enlarged prostate with lower urinary tract symptoms: Secondary | ICD-10-CM

## 2023-07-08 LAB — LIPID PANEL
Cholesterol: 155 mg/dL (ref 0–200)
HDL: 46.4 mg/dL (ref >39.00)
LDL Cholesterol: 89 mg/dL (ref 0–99)
NonHDL: 109.01
Total CHOL/HDL Ratio: 3
Triglycerides: 102 mg/dL (ref 0.0–149.0)
VLDL: 20.4 mg/dL (ref 0.0–40.0)

## 2023-07-08 LAB — COMPREHENSIVE METABOLIC PANEL
ALT: 23 U/L (ref 0–53)
AST: 20 U/L (ref 0–37)
Albumin: 4.5 g/dL (ref 3.5–5.2)
Alkaline Phosphatase: 69 U/L (ref 39–117)
BUN: 9 mg/dL (ref 6–23)
CO2: 29 meq/L (ref 19–32)
Calcium: 9.5 mg/dL (ref 8.4–10.5)
Chloride: 102 meq/L (ref 96–112)
Creatinine, Ser: 0.91 mg/dL (ref 0.40–1.50)
GFR: 94.01 mL/min (ref >60.00)
Glucose, Bld: 97 mg/dL (ref 70–99)
Potassium: 4.1 meq/L (ref 3.5–5.1)
Sodium: 139 meq/L (ref 135–145)
Total Bilirubin: 0.9 mg/dL (ref 0.2–1.2)
Total Protein: 7.3 g/dL (ref 6.0–8.3)

## 2023-07-08 LAB — PSA: PSA: 0.53 ng/mL (ref 0.10–4.00)

## 2023-07-08 LAB — CBC
HCT: 41 % (ref 39.0–52.0)
Hemoglobin: 14 g/dL (ref 13.0–17.0)
MCHC: 34.1 g/dL (ref 30.0–36.0)
MCV: 97.1 fl (ref 78.0–100.0)
Platelets: 158 10*3/uL (ref 150.0–400.0)
RBC: 4.22 Mil/uL (ref 4.22–5.81)
RDW: 13.3 % (ref 11.5–15.5)
WBC: 4.5 10*3/uL (ref 4.0–10.5)

## 2023-07-08 LAB — TSH: TSH: 1.58 u[IU]/mL (ref 0.35–5.50)

## 2023-07-09 ENCOUNTER — Other Ambulatory Visit

## 2023-07-09 ENCOUNTER — Encounter: Payer: Self-pay | Admitting: Adult Health

## 2023-07-09 ENCOUNTER — Other Ambulatory Visit: Payer: BC Managed Care – PPO

## 2023-07-29 ENCOUNTER — Other Ambulatory Visit: Payer: Self-pay | Admitting: Adult Health

## 2023-07-29 DIAGNOSIS — B49 Unspecified mycosis: Secondary | ICD-10-CM

## 2023-07-30 NOTE — Telephone Encounter (Signed)
 Okay for refill?

## 2023-07-31 NOTE — Telephone Encounter (Signed)
 Pt stated that he does not need this

## 2023-12-17 DIAGNOSIS — D2371 Other benign neoplasm of skin of right lower limb, including hip: Secondary | ICD-10-CM | POA: Diagnosis not present

## 2023-12-17 DIAGNOSIS — D485 Neoplasm of uncertain behavior of skin: Secondary | ICD-10-CM | POA: Diagnosis not present

## 2023-12-17 DIAGNOSIS — L918 Other hypertrophic disorders of the skin: Secondary | ICD-10-CM | POA: Diagnosis not present
